# Patient Record
Sex: Male | Born: 1990 | Marital: Single | Smoking: Never smoker
Health system: Southern US, Community
[De-identification: ages and names within clinical notes are randomized; demographics above are authoritative.]

## PROBLEM LIST (undated history)

## (undated) DIAGNOSIS — E611 Iron deficiency: Secondary | ICD-10-CM

## (undated) DIAGNOSIS — I10 Essential (primary) hypertension: Secondary | ICD-10-CM

## (undated) DIAGNOSIS — T7840XA Allergy, unspecified, initial encounter: Secondary | ICD-10-CM

## (undated) DIAGNOSIS — R000 Tachycardia, unspecified: Secondary | ICD-10-CM

## (undated) DIAGNOSIS — R110 Nausea: Secondary | ICD-10-CM

## (undated) DIAGNOSIS — G901 Familial dysautonomia [Riley-Day]: Secondary | ICD-10-CM

## (undated) DIAGNOSIS — E559 Vitamin D deficiency, unspecified: Secondary | ICD-10-CM

## (undated) DIAGNOSIS — Z789 Other specified health status: Secondary | ICD-10-CM

## (undated) DIAGNOSIS — Z8639 Personal history of other endocrine, nutritional and metabolic disease: Secondary | ICD-10-CM

## (undated) DIAGNOSIS — K529 Noninfective gastroenteritis and colitis, unspecified: Secondary | ICD-10-CM

## (undated) DIAGNOSIS — Z903 Acquired absence of stomach [part of]: Secondary | ICD-10-CM

## (undated) DIAGNOSIS — E119 Type 2 diabetes mellitus without complications: Secondary | ICD-10-CM

## (undated) DIAGNOSIS — R42 Dizziness and giddiness: Secondary | ICD-10-CM

## (undated) DIAGNOSIS — R0602 Shortness of breath: Secondary | ICD-10-CM

## (undated) HISTORY — DX: Allergy, unspecified, initial encounter: T78.40XA

## (undated) HISTORY — DX: Noninfective gastroenteritis and colitis, unspecified: K52.9

## (undated) HISTORY — DX: Shortness of breath: R06.02

## (undated) HISTORY — DX: Essential (primary) hypertension: I10

## (undated) HISTORY — DX: Tachycardia, unspecified: R00.0

## (undated) HISTORY — DX: Personal history of other endocrine, nutritional and metabolic disease: Z86.39

## (undated) HISTORY — DX: Vitamin D deficiency, unspecified: E55.9

## (undated) HISTORY — DX: Acquired absence of stomach (part of): Z90.3

## (undated) HISTORY — DX: Familial dysautonomia (riley-day): G90.1

## (undated) HISTORY — PX: LAPAROSCOPIC GASTRIC SLEEVE RESECTION: SHX5895

## (undated) HISTORY — DX: Dizziness and giddiness: R42

## (undated) HISTORY — DX: Nausea: R11.0

## (undated) HISTORY — DX: Iron deficiency: E61.1

## (undated) HISTORY — DX: Other specified health status: Z78.9

## (undated) HISTORY — PX: OTHER SURGICAL HISTORY: SHX169

---
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
## 2008-10-25 ENCOUNTER — Emergency Department (HOSPITAL_COMMUNITY): Admission: EM | Admit: 2008-10-25 | Discharge: 2008-10-25 | Payer: Self-pay | Admitting: Emergency Medicine

## 2010-01-05 ENCOUNTER — Emergency Department (HOSPITAL_COMMUNITY): Admission: EM | Admit: 2010-01-05 | Discharge: 2010-01-05 | Payer: Self-pay | Admitting: Family Medicine

## 2011-08-23 ENCOUNTER — Emergency Department (HOSPITAL_COMMUNITY)
Admission: EM | Admit: 2011-08-23 | Discharge: 2011-08-23 | Disposition: A | Discharge status: Home / Self Care | Payer: Self-pay | Attending: Emergency Medicine | Admitting: Emergency Medicine

## 2011-08-23 ENCOUNTER — Emergency Department (HOSPITAL_COMMUNITY): Payer: Self-pay

## 2011-08-23 DIAGNOSIS — R112 Nausea with vomiting, unspecified: Secondary | ICD-10-CM | POA: Insufficient documentation

## 2011-08-23 DIAGNOSIS — R10811 Right upper quadrant abdominal tenderness: Secondary | ICD-10-CM | POA: Insufficient documentation

## 2011-08-23 DIAGNOSIS — R1013 Epigastric pain: Secondary | ICD-10-CM | POA: Insufficient documentation

## 2011-08-23 LAB — COMPREHENSIVE METABOLIC PANEL
Albumin: 4.7 g/dL (ref 3.5–5.2)
Alkaline Phosphatase: 65 U/L (ref 39–117)
BUN: 11 mg/dL (ref 6–23)
CO2: 26 mEq/L (ref 19–32)
Chloride: 101 mEq/L (ref 96–112)
Creatinine, Ser: 0.79 mg/dL (ref 0.50–1.10)
GFR calc Af Amer: >60 mL/min (ref >60)
GFR calc non Af Amer: >60 mL/min (ref >60)
Glucose, Bld: 97 mg/dL (ref 70–99)
Potassium: 3.9 mEq/L (ref 3.5–5.1)
Total Bilirubin: 0.2 mg/dL — ABNORMAL LOW (ref 0.3–1.2)

## 2011-08-23 LAB — CBC
Hemoglobin: 13.6 g/dL (ref 12.0–15.0)
MCH: 27.3 pg (ref 26.0–34.0)
MCHC: 33.9 g/dL (ref 30.0–36.0)
Platelets: 304 10*3/uL (ref 150–400)
RBC: 4.98 MIL/uL (ref 3.87–5.11)

## 2011-08-23 LAB — URINALYSIS, ROUTINE W REFLEX MICROSCOPIC
Bilirubin Urine: NEGATIVE
Hgb urine dipstick: NEGATIVE
Nitrite: NEGATIVE
Specific Gravity, Urine: 1.028 (ref 1.005–1.030)
pH: 6.5 (ref 5.0–8.0)

## 2011-08-23 LAB — DIFFERENTIAL
Eosinophils Absolute: 0.1 10*3/uL (ref 0.0–0.7)
Lymphocytes Relative: 18 % (ref 12–46)
Lymphs Abs: 1.4 10*3/uL (ref 0.7–4.0)
Monocytes Relative: 5 % (ref 3–12)
Neutro Abs: 6.1 10*3/uL (ref 1.7–7.7)
Neutrophils Relative %: 76 % (ref 43–77)

## 2011-08-23 LAB — PREGNANCY, URINE: Preg Test, Ur: NEGATIVE

## 2011-09-29 LAB — POCT I-STAT, CHEM 8
BUN: 10
Calcium, Ion: 1.16
Chloride: 104
Creatinine, Ser: 0.9
Glucose, Bld: 78
HCT: 39
Hemoglobin: 13.3
Potassium: 4
Sodium: 137
TCO2: 24

## 2011-09-29 LAB — URINALYSIS, ROUTINE W REFLEX MICROSCOPIC
Bilirubin Urine: NEGATIVE
Glucose, UA: NEGATIVE
Ketones, ur: NEGATIVE
Protein, ur: NEGATIVE
pH: 7

## 2014-06-16 ENCOUNTER — Emergency Department (HOSPITAL_COMMUNITY)
Admission: EM | Admit: 2014-06-16 | Discharge: 2014-06-16 | Disposition: A | Discharge status: Home / Self Care | Payer: BC Managed Care – PPO | Attending: Emergency Medicine | Admitting: Emergency Medicine

## 2014-06-16 ENCOUNTER — Encounter (HOSPITAL_COMMUNITY): Payer: Self-pay | Admitting: Emergency Medicine

## 2014-06-16 DIAGNOSIS — R1013 Epigastric pain: Secondary | ICD-10-CM | POA: Insufficient documentation

## 2014-06-16 DIAGNOSIS — R1010 Upper abdominal pain, unspecified: Secondary | ICD-10-CM

## 2014-06-16 DIAGNOSIS — E119 Type 2 diabetes mellitus without complications: Secondary | ICD-10-CM | POA: Insufficient documentation

## 2014-06-16 HISTORY — DX: Type 2 diabetes mellitus without complications: E11.9

## 2014-06-16 LAB — COMPREHENSIVE METABOLIC PANEL
ALT: 28 U/L (ref 0–35)
AST: 35 U/L (ref 0–37)
Albumin: 4 g/dL (ref 3.5–5.2)
Alkaline Phosphatase: 53 U/L (ref 39–117)
BUN: 8 mg/dL (ref 6–23)
CALCIUM: 9.4 mg/dL (ref 8.4–10.5)
CO2: 25 meq/L (ref 19–32)
CREATININE: 0.91 mg/dL (ref 0.50–1.10)
Chloride: 101 mEq/L (ref 96–112)
GFR calc non Af Amer: 88 mL/min — ABNORMAL LOW (ref >90)
GLUCOSE: 105 mg/dL — AB (ref 70–99)
Potassium: 4.4 mEq/L (ref 3.7–5.3)
Sodium: 136 mEq/L — ABNORMAL LOW (ref 137–147)
TOTAL PROTEIN: 8.1 g/dL (ref 6.0–8.3)
Total Bilirubin: 0.2 mg/dL — ABNORMAL LOW (ref 0.3–1.2)

## 2014-06-16 LAB — CBC
HCT: 43.9 % (ref 36.0–46.0)
HEMOGLOBIN: 14.6 g/dL (ref 12.0–15.0)
MCH: 25.5 pg — AB (ref 26.0–34.0)
MCHC: 33.3 g/dL (ref 30.0–36.0)
MCV: 76.7 fL — ABNORMAL LOW (ref 78.0–100.0)
Platelets: 325 10*3/uL (ref 150–400)
RBC: 5.72 MIL/uL — ABNORMAL HIGH (ref 3.87–5.11)
RDW: 15.9 % — ABNORMAL HIGH (ref 11.5–15.5)
WBC: 6.6 10*3/uL (ref 4.0–10.5)

## 2014-06-16 LAB — LIPASE, BLOOD: LIPASE: 24 U/L (ref 11–59)

## 2014-06-16 MED ORDER — FAMOTIDINE 20 MG PO TABS
20.0000 mg | ORAL_TABLET | Freq: Once | ORAL | Status: AC
  Administered 2014-06-16: 20 mg via ORAL
  Filled 2014-06-16: qty 1

## 2014-06-16 MED ORDER — HYDROCODONE-ACETAMINOPHEN 5-325 MG PO TABS
1.0000 | ORAL_TABLET | Freq: Once | ORAL | Status: AC
  Administered 2014-06-16: 1 via ORAL
  Filled 2014-06-16: qty 1

## 2014-06-16 NOTE — ED Notes (Addendum)
Pt arrived to the ED with a complaint of abdominal pain. Pt has experienced pain for about 3 hours.  Pts' pain is located mid abdomen on the right side.  Pt states that it hurts when pushed upon.  Pt state initially the pain caused her to double over.  Pt is in a sexual transition and is on testosterone.

## 2014-06-16 NOTE — Discharge Instructions (Signed)
You may try pepcid, and/or maalox as need for symptom relief. Follow up with primary care doctor this Monday if symptoms fail to improve/resolve. Return to ER right away if worse, worsening or severe pain, persistent vomiting, fevers, other concern.   You were given pain medication in the ER - no driving for the next 4 hours.     Abdominal Pain Many things can cause abdominal pain. Usually, abdominal pain is not caused by a disease and will improve without treatment. It can often be observed and treated at home. Your health care provider will do a physical exam and possibly order blood tests and X-rays to help determine the seriousness of your pain. However, in many cases, more time must pass before a clear cause of the pain can be found. Before that point, your health care provider may not know if you need more testing or further treatment. HOME CARE INSTRUCTIONS  Monitor your abdominal pain for any changes. The following actions may help to alleviate any discomfort you are experiencing:  Only take over-the-counter or prescription medicines as directed by your health care provider.  Do not take laxatives unless directed to do so by your health care provider.  Try a clear liquid diet (broth, tea, or water) as directed by your health care provider. Slowly move to a bland diet as tolerated. SEEK MEDICAL CARE IF:  You have unexplained abdominal pain.  You have abdominal pain associated with nausea or diarrhea.  You have pain when you urinate or have a bowel movement.  You experience abdominal pain that wakes you in the night.  You have abdominal pain that is worsened or improved by eating food.  You have abdominal pain that is worsened with eating fatty foods.  You have a fever. SEEK IMMEDIATE MEDICAL CARE IF:   Your pain does not go away within 2 hours.  You keep throwing up (vomiting).  Your pain is felt only in portions of the abdomen, such as the right side or the left lower  portion of the abdomen.  You pass bloody or black tarry stools. MAKE SURE YOU:  Understand these instructions.   Will watch your condition.   Will get help right away if you are not doing well or get worse.  Document Released: 09/23/2005 Document Revised: 12/19/2013 Document Reviewed: 08/23/2013 [facility] Patient Information 2015 [employer] [place]. This information is not intended to replace advice given to you by your health care provider. Make sure you discuss any questions you have with your health care provider.

## 2014-06-16 NOTE — ED Provider Notes (Signed)
CSN: [id number]     Arrival date & time 06/16/14  2001 History   First MD Initiated Contact with Patient 06/16/14 2021     Chief Complaint  Patient presents with  . Abdominal Pain     (Consider location/radiation/quality/duration/timing/severity/associated sxs/prior Treatment) Patient is a 23 y.o. male presenting with abdominal pain. The history is provided by the patient.  Abdominal Pain Associated symptoms: no chest pain, no chills, no constipation, no cough, no diarrhea, no dysuria, no fever, no hematuria, no shortness of breath, no sore throat, no vaginal bleeding, no vaginal discharge and no vomiting   pt c/o epigastric/upper abd pain. Constant, dull, moderate, non radiating. Pt states was at work, at rest, had stretched arms up above head prior to onset symptoms. No nv. Having normal bms, no diarrhea or constipation. No prior abd surgery. No hx pud, pancreatitis, or gallstones. No back or flank pain. No gu c/o. No mid to lower abd pain. No chest pain or pleuritic pain. No sob, cough or uri c/o. No fever or chills. Normal appetite.      Past Medical History  Diagnosis Date  . Diabetes mellitus without complication    History reviewed. No pertinent past surgical history. History reviewed. No pertinent family history. History  Substance Use Topics  . Smoking status: Never Smoker   . Smokeless tobacco: Not on file  . Alcohol Use: No   OB History   Grav Para Term Preterm Abortions TAB SAB Ect Mult Living                 Review of Systems  Constitutional: Negative for fever and chills.  HENT: Negative for sore throat.   Eyes: Negative for redness.  Respiratory: Negative for cough and shortness of breath.   Cardiovascular: Negative for chest pain.  Gastrointestinal: Positive for abdominal pain. Negative for vomiting, diarrhea, constipation and abdominal distention.  Genitourinary: Negative for dysuria, hematuria, flank pain, vaginal bleeding and vaginal discharge.   Musculoskeletal: Negative for back pain and neck pain.  Skin: Negative for rash.  Neurological: Negative for headaches.  Hematological: Does not bruise/bleed easily.  Psychiatric/Behavioral: Negative for confusion.      Allergies  Review of patient's allergies indicates not on file.  Home Medications   Prior to Admission medications   Not on File   BP 148/54  Pulse 94  Temp(Src) 99 F (37.2 C) (Oral)  Resp 18  SpO2 99%  LMP 12/16/2013 Physical Exam  Nursing note and vitals reviewed. Constitutional: She appears well-developed and well-nourished. No distress.  HENT:  Mouth/Throat: Oropharynx is clear and moist.  Eyes: Conjunctivae are normal. No scleral icterus.  Neck: Neck supple. No tracheal deviation present.  Cardiovascular: Normal rate, regular rhythm, normal heart sounds and intact distal pulses.   Pulmonary/Chest: Effort normal and breath sounds normal. No respiratory distress.  Abdominal: Soft. Normal appearance and bowel sounds are normal. She exhibits no distension and no mass. There is no rebound and no guarding.  Obese. No hernia. Mild epigastric/upper abd tenderness.   Genitourinary:  No cva tenderness  Musculoskeletal: She exhibits no edema.  Neurological: She is alert.  Skin: Skin is warm and dry. No rash noted. She is not diaphoretic.  Psychiatric: She has a normal mood and affect.    ED Course  Procedures (including critical care time) Labs Review   Results for orders placed during the hospital encounter of 06/16/14  CBC      Result Value Ref Range   WBC 6.6  4.0 -  10.5 K/uL   RBC 5.72 (*) 3.87 - 5.11 MIL/uL   Hemoglobin 14.6  12.0 - 15.0 g/dL   HCT 95.243.9  84.136.0 - 32.446.0 %   MCV 76.7 (*) 78.0 - 100.0 fL   MCH 25.5 (*) 26.0 - 34.0 pg   MCHC 33.3  30.0 - 36.0 g/dL   RDW 40.115.9 (*) 02.711.5 - 25.315.5 %   Platelets 325  150 - 400 K/uL  COMPREHENSIVE METABOLIC PANEL      Result Value Ref Range   Sodium 136 (*) 137 - 147 mEq/L   Potassium 4.4  3.7 - 5.3  mEq/L   Chloride 101  96 - 112 mEq/L   CO2 25  19 - 32 mEq/L   Glucose, Bld 105 (*) 70 - 99 mg/dL   BUN 8  6 - 23 mg/dL   Creatinine, Ser 6.640.91  0.50 - 1.10 mg/dL   Calcium 9.4  8.4 - 40.310.5 mg/dL   Total Protein 8.1  6.0 - 8.3 g/dL   Albumin 4.0  3.5 - 5.2 g/dL   AST 35  0 - 37 U/L   ALT 28  0 - 35 U/L   Alkaline Phosphatase 53  39 - 117 U/L   Total Bilirubin 0.2 (*) 0.3 - 1.2 mg/dL   GFR calc non Af Amer 88 (*) >90 mL/min   GFR calc Af Amer >90  >90 mL/min  LIPASE, BLOOD      Result Value Ref Range   Lipase 24  11 - 59 U/L     MDM  Labs. Pepcid, vicodin po (pt has ride, does not have to drive).  Reviewed nursing notes and prior charts for additional history.   Recheck abd soft nt.  Pt afeb.   Recheck tolerating po. abd soft nt. Pt appears stable for d/c.     Suzi RootsKevin E Steinl, MD 06/16/14 2228

## 2014-08-25 ENCOUNTER — Encounter: Payer: Self-pay | Admitting: *Deleted

## 2014-08-25 ENCOUNTER — Ambulatory Visit (INDEPENDENT_AMBULATORY_CARE_PROVIDER_SITE_OTHER): Payer: BC Managed Care – PPO | Admitting: Internal Medicine

## 2014-08-25 VITALS — BP 116/72 | HR 85 | Temp 98.3°F | Resp 20 | Ht 63.0 in | Wt 288.8 lb

## 2014-08-25 DIAGNOSIS — Z6841 Body Mass Index (BMI) 40.0 and over, adult: Secondary | ICD-10-CM

## 2014-08-25 DIAGNOSIS — J029 Acute pharyngitis, unspecified: Secondary | ICD-10-CM

## 2014-08-25 LAB — POCT RAPID STREP A (OFFICE): RAPID STREP A SCREEN: NEGATIVE

## 2014-08-25 MED ORDER — AMOXICILLIN 500 MG PO CAPS: 1000.0000 mg | ORAL_CAPSULE | Freq: Two times a day (BID) | ORAL | Status: AC

## 2014-08-25 NOTE — Progress Notes (Signed)
   Subjective:  This chart was scribed for Mike Sia, MD by Carl Best, Medical Scribe. This patient was seen in Room 12 and the patient's care was started at 2:13 PM.   Patient ID: Mike Boyd, male    DOB: 03-26-1991, 23 y.o.   MRN: [id number]  HPI HPI Comments: Mike Boyd is a 23 y.o. male who presents to the [facility] complaining of sore throat for three days.  Progressively worse.  No cough, fever, fatigue, or rhinorrhea.    Past Medical History  Diagnosis Date  . Diabetes mellitus without complication   . Allergy   . Colitis   . Hypertension    History reviewed. No pertinent past surgical history. History reviewed. No pertinent family history. History   Social History  . Marital Status: Single    Spouse Name: N/A    Number of Children: N/A  . Years of Education: N/A   Occupational History  . Not on file.   Social History Main Topics  . Smoking status: Never Smoker   . Smokeless tobacco: Never Used  . Alcohol Use: No  . Drug Use: No  . Sexual Activity: Yes   Other Topics Concern  . Not on file   Social History Narrative  . No narrative on file   No Known Allergies   Review of Systems  Constitutional: Negative for fever and fatigue.  HENT: Negative for rhinorrhea.   Respiratory: Negative for cough.      Objective:  Physical Exam  Nursing note and vitals reviewed. Constitutional: She is oriented to person, place, and time. She appears well-developed and well-nourished. No distress.  HENT:  Head: Normocephalic and atraumatic.  Nose: Nose normal.  Throat with enlarged tonsils with exudate.  Eyes: Conjunctivae and EOM are normal. Pupils are equal, round, and reactive to light.  Neck: Neck supple.  Cardiovascular: Normal rate.   Pulmonary/Chest: Effort normal.  Lymphadenopathy:    She has cervical adenopathy.  Tender anterior cervical nodes.  Neurological: She is alert and oriented to person, place, and  time. No cranial nerve deficit.  Psychiatric: She has a normal mood and affect. Her behavior is normal.   Results for orders placed in visit on 08/25/14  POCT RAPID STREP A (OFFICE)      Result Value Ref Range   Rapid Strep A Screen Negative  Negative     BP 116/72  Pulse 85  Temp(Src) 98.3 F (36.8 C) (Oral)  Resp 20  Ht  (1.6 m)  Wt 288 lb 12 oz (130.976 kg)  BMI 51.16 kg/m2 Assessment & Plan:    I personally performed the services described in this documentation, which was scribed in my presence. The recorded information has been reviewed and is accurate. Acute pharyngitis, unspecified pharyngitis type - Plan: POCT rapid strep A, Throat culture Loney Loh)  Meds ordered this encounter  Medications  . amoxicillin (AMOXIL) 500 MG capsule    Sig: Take 2 capsules (1,000 mg total) by mouth 2 (two) times daily.    Dispense:  40 capsule    Refill:  0   TC sent/call

## 2014-08-27 LAB — CULTURE, GROUP A STREP

## 2014-09-12 ENCOUNTER — Ambulatory Visit (INDEPENDENT_AMBULATORY_CARE_PROVIDER_SITE_OTHER): Payer: BC Managed Care – PPO | Admitting: Family Medicine

## 2014-09-12 VITALS — BP 110/80 | HR 81 | Temp 97.9°F | Resp 16 | Ht 64.0 in | Wt 285.0 lb

## 2014-09-12 DIAGNOSIS — H109 Unspecified conjunctivitis: Secondary | ICD-10-CM

## 2014-09-12 MED ORDER — TOBRAMYCIN 0.3 % OP SOLN: 1.0000 [drp] | Freq: Four times a day (QID) | OPHTHALMIC | Status: DC

## 2014-09-12 NOTE — Progress Notes (Signed)
Chief Complaint:  Chief Complaint  Patient presents with  . Eye Problem    x3 days; pt has been having pink eye sxs and has not been using any OTC eye meds   . Eye Drainage    x3 days; pt states eye drains more during the day  . Itchy Eye    x3 days; pt states eye is very itchy and it burns    HPI: 23 y.o. year old male presents with a 3 day history of eye redness involving left eye.  no contact use  No sick contacts, recent antibiotics, or recent travels.   No leg trauma, sedentary periods, h/o cancer, or tobacco use.  Past Medical History  Diagnosis Date  . Diabetes mellitus without complication   . Allergy   . Colitis   . Hypertension      Home Meds: Prior to Admission medications   Medication Sig Start Date End Date Taking? Authorizing Provider  beclomethasone (QVAR) 40 MCG/ACT inhaler Inhale 2 puffs into the lungs 2 (two) times daily.   Yes Historical Provider, MD  hydrochlorothiazide (HYDRODIURIL) 25 MG tablet Take 25 mg by mouth daily as needed (fluid).   Yes Historical Provider, MD  testosterone cypionate (DEPOTESTOTERONE CYPIONATE) 100 MG/ML injection Inject 150 mg into the muscle every 14 (fourteen) days. For IM use only. Inject on Saturdays.   Yes Historical Provider, MD    Allergies: No Known Allergies  History   Social History  . Marital Status: Single    Spouse Name: N/A    Number of Children: N/A  . Years of Education: N/A   Occupational History  . Not on file.   Social History Main Topics  . Smoking status: Never Smoker   . Smokeless tobacco: Never Used  . Alcohol Use: No  . Drug Use: No  . Sexual Activity: Yes   Other Topics Concern  . Not on file   Social History Narrative  . No narrative on file     Review of Systems: Constitutional: negative for chills, fever, night sweats or weight changes Cardiovascular: negative for chest pain or palpitations Respiratory: negative for hemoptysis, wheezing, or shortness of breath Abdominal:  negative for abdominal pain, nausea, vomiting or diarrhea Dermatological: negative for rash Neurologic: negative for headache   Physical Exam: Blood pressure 110/80, pulse 81, temperature 97.9 F (36.6 C), temperature source Oral, resp. rate 16, height  (1.626 m), weight 285 lb (129.275 kg), SpO2 96.00%., Body mass index is 48.9 kg/(m^2). General: Well developed, well nourished, in no acute distress. Head: Normocephalic, atraumatic, nares are congested. Bilateral auditory canals clear, TM's are without perforation, pearly grey with reflective cone of light bilaterally. No sinus TTP. Oral cavity moist, dentition normal. Posterior pharynx with post nasal drip and mild erythema. No peritonsillar abscess or tonsillar exudate. Eyes:  Injection left eye, mild discharge at lid margins Neck: Supple. No thyromegaly. Full ROM. No lymphadenopathy. Lungs: Coarse breath sounds bilaterally without wheezes, rales, or rhonchi. Breathing is unlabored.  Heart: RRR with S1 S2. No murmurs, rubs, or gallops appreciated. Msk:  Strength and tone normal for age. Extremities: No clubbing or cyanosis. No edema. Neuro: Alert and oriented X 3. Moves all extremities spontaneously. CNII-XII grossly in tact. Psych:  Responds to questions appropriately with a normal affect.    ASSESSMENT AND PLAN:  23 y.o. year old male with conjunctivitis involving left eye. Conjunctivitis of left eye - Plan: tobramycin (TOBREX) 0.3 % ophthalmic solution  Pt ed given including hygiene measures and  avoiding use of contact lenses -RTC precautions -RTC 3-5 days if no improvement  Signed, Elvina Sidle, MD 09/12/2014 1:35 PM

## 2014-09-12 NOTE — Patient Instructions (Signed)

## 2014-10-10 ENCOUNTER — Ambulatory Visit (INDEPENDENT_AMBULATORY_CARE_PROVIDER_SITE_OTHER): Payer: BC Managed Care – PPO

## 2014-10-10 ENCOUNTER — Ambulatory Visit (INDEPENDENT_AMBULATORY_CARE_PROVIDER_SITE_OTHER): Payer: BC Managed Care – PPO | Admitting: Family Medicine

## 2014-10-10 VITALS — BP 116/84 | HR 88 | Temp 98.4°F | Resp 20 | Ht 65.0 in | Wt 284.6 lb

## 2014-10-10 DIAGNOSIS — G8911 Acute pain due to trauma: Secondary | ICD-10-CM

## 2014-10-10 DIAGNOSIS — M25512 Pain in left shoulder: Secondary | ICD-10-CM

## 2014-10-10 DIAGNOSIS — S83512A Sprain of anterior cruciate ligament of left knee, initial encounter: Secondary | ICD-10-CM

## 2014-10-10 NOTE — Progress Notes (Signed)
  Mike SchlichterKamisha Dansby - 23 y.o. male MRN [id number]  Date of birth: 11/25/1991  SUBJECTIVE:  Including CC & ROS.  patient C/O: left shoulder pain  Onset of symptoms: 2 days ago Symptoms: Patient reports that 2 nights ago she was having dinner with a friend when her  friend starting having a seizure and was about to fall to the ground when the patient caught her in her arms. Her friend impacted into her left shoulder while falling. Ever since this event she's had some shoulder pain with range of motion but not at rest. Describes pain as a dull ache. She denies any previous injury to the shoulder. Pain is worse with rotational activities such as putting on her bra. No numbness or tingling. No neck pain  Relieving factors: no ice or NSAIDS taken Worsened by: overhead movement   ROS:  Constitutional:  No fever, chills, or fatigue.  Respiratory:  No shortness of breath, cough, or wheezing Cardiovascular:  No palpitations, chest pain or syncope Gastrointestinal:  No nausea, no abdominal pain Neuro: no numbness or tingling no neck pain Review of systems otherwise negative except for what is stated in HPI  HISTORY: Past Medical, Surgical, Social, and Family History Reviewed & Updated per EMR. Pertinent Historical Findings include: Asthma with no current breathing issues  PHYSICAL EXAM:  VS: BP:116/84 mmHg  HR:88bpm  TEMP:98.4 F (36.9 C)(Oral)  RESP:98 %  HT:5\' 5"  (165.1 cm)   WT:284 lb 9.6 oz (129.094 kg)  BMI:47.5 PHYSICAL EXAM: Respiratory:  Lungs are clear to auscultation, Respirations are non-labored, Symmetrical chest wall expansion.   SHOULDER EXAM:  General: well nourished Skin of UE: warm; dry, no rashes, lesions, ecchymosis or erythema. Vascular: radial pulses 2+ bilaterally Neurologically: Normal sensation with no sensory or motor defects in C4-C8, bilateral Palpation: moderate tenderness over the A Rosie PlaceC joint and acromion, no clavicle tenderness at mid or proximal shaft, mild bicipital  grove tenderness, mildsupraspinatus tenderness ROM active/passive: symmetric full 180 degree of abduction and forward flexion pain at end range of motion, symmetric internal (80-90) and external rotation (90) with shoulder at 90 abduction. Appley's scratch test equal bilaterally Strength testing: 5/5 symmetric strength in internal and external rotation, forward flexion, adduction and abduction     Special Test: Negative Neer's, neg Hawkins, negative empty can, neg O'Brien, neg     speeds, neg apprehension  4 view x-rays of the left shoulder and humerus reveal no signs of fracture, no dislocation, no evidence of a.c. joint separation  ASSESSMENT & PLAN:  AC joint sprain with no separation   Recommendations: Recommend conservative management with sling as needed for comfort. Advised patient not to become dependent on using the sling as this can increase stiffness in the shoulder and elbow Recommend anti-inflammatories patient was offered a prescription for Mobic to prefer to take over-the-counter Aleve. Recommended taking 2 Aleve in the morning with breakfast and 2 Aleve at dinner time. Advised patient to work on range of motion and stretching Advised patient that I suspect her course recovery will improve over the next 7-10 days. If symptoms are persisting at 3 weeks to a month she should follow up in office Patient provided with a work note for today. No other work restriction were requested by the patient.

## 2014-11-14 ENCOUNTER — Ambulatory Visit (INDEPENDENT_AMBULATORY_CARE_PROVIDER_SITE_OTHER): Payer: BC Managed Care – PPO | Admitting: Emergency Medicine

## 2014-11-14 VITALS — BP 122/84 | HR 87 | Temp 98.0°F | Resp 16 | Ht 64.0 in | Wt 295.6 lb

## 2014-11-14 DIAGNOSIS — R5383 Other fatigue: Secondary | ICD-10-CM

## 2014-11-14 DIAGNOSIS — R55 Syncope and collapse: Secondary | ICD-10-CM

## 2014-11-14 LAB — POCT UA - MICROSCOPIC ONLY
BACTERIA, U MICROSCOPIC: NEGATIVE
Casts, Ur, LPF, POC: NEGATIVE
Crystals, Ur, HPF, POC: NEGATIVE
Epithelial cells, urine per micros: NEGATIVE
Mucus, UA: NEGATIVE
RBC, urine, microscopic: NEGATIVE
WBC, UR, HPF, POC: NEGATIVE
YEAST UA: NEGATIVE

## 2014-11-14 LAB — CBC WITH DIFFERENTIAL/PLATELET
Basophils Absolute: 0 10*3/uL (ref 0.0–0.1)
Basophils Relative: 0 % (ref 0–1)
EOS PCT: 3 % (ref 0–5)
Eosinophils Absolute: 0.2 10*3/uL (ref 0.0–0.7)
HCT: 41.2 % (ref 36.0–46.0)
HEMOGLOBIN: 13.6 g/dL (ref 12.0–15.0)
LYMPHS ABS: 2.4 10*3/uL (ref 0.7–4.0)
LYMPHS PCT: 31 % (ref 12–46)
MCH: 26.8 pg (ref 26.0–34.0)
MCHC: 33 g/dL (ref 30.0–36.0)
MCV: 81.1 fL (ref 78.0–100.0)
MPV: 9.3 fL — AB (ref 9.4–12.4)
Monocytes Absolute: 0.6 10*3/uL (ref 0.1–1.0)
Monocytes Relative: 8 % (ref 3–12)
NEUTROS ABS: 4.6 10*3/uL (ref 1.7–7.7)
NEUTROS PCT: 58 % (ref 43–77)
Platelets: 339 10*3/uL (ref 150–400)
RBC: 5.08 MIL/uL (ref 3.87–5.11)
RDW: 15.1 % (ref 11.5–15.5)
WBC: 7.9 10*3/uL (ref 4.0–10.5)

## 2014-11-14 LAB — GLUCOSE, POCT (MANUAL RESULT ENTRY): POC Glucose: 85 mg/dl (ref 70–99)

## 2014-11-14 LAB — POCT GLYCOSYLATED HEMOGLOBIN (HGB A1C): Hemoglobin A1C: 5.9

## 2014-11-14 LAB — POCT URINALYSIS DIPSTICK
BILIRUBIN UA: NEGATIVE
GLUCOSE UA: NEGATIVE
NITRITE UA: NEGATIVE
Protein, UA: NEGATIVE
RBC UA: NEGATIVE
Spec Grav, UA: 1.02
Urobilinogen, UA: 0.2
pH, UA: 7

## 2014-11-14 NOTE — Progress Notes (Signed)
[facility] [address], [place] [id number] [phone]  Date:  11/14/2014   Name:  Mike Boyd   DOB:  1991-01-17   MRN:  [id number]  PCP:  No PCP Per Patient    Chief Complaint: Dizziness; Fatigue; and Diabetes   History of Present Illness:  Mike Boyd is a 23 y.o. very pleasant male patient who presents with the following:  Patient works in call center.  History of HBP and NIDDM by chart.  Patient says she is a "pre-diabetic". Thirsty, has polyuria.  On Sunday began to slur her words, became hot and dizzy and passed out.  No seizure. No head injury No nausea or vomiting.  Says having diarrhea. No recurrence. No antecedent illness of injury.  No neuro or visual symptoms No improvement with over the counter medications or other home remedies.  Denies other complaint or health concern today.    Patient Active Problem List   Diagnosis Date Noted  . BMI 50.0-59.9, adult 08/25/2014    Past Medical History  Diagnosis Date  . Diabetes mellitus without complication   . Allergy   . Colitis   . Hypertension     No past surgical history on file.  History  Substance Use Topics  . Smoking status: Never Smoker   . Smokeless tobacco: Never Used  . Alcohol Use: No    No family history on file.  No Known Allergies  Medication list has been reviewed and updated.  Current Outpatient Prescriptions on File Prior to Visit  Medication Sig Dispense Refill  . beclomethasone (QVAR) 40 MCG/ACT inhaler Inhale 2 puffs into the lungs 2 (two) times daily.    . hydrochlorothiazide (HYDRODIURIL) 25 MG tablet Take 25 mg by mouth daily as needed (fluid).    Marland Kitchen. testosterone cypionate (DEPOTESTOTERONE CYPIONATE) 100 MG/ML injection Inject 150 mg into the muscle every 14 (fourteen) days. For IM use only. Inject on Saturdays.     No current facility-administered medications on file prior to visit.    Review of Systems:  As per HPI, otherwise negative.     Physical Examination: Filed Vitals:   11/14/14 1226  BP: 122/84  Pulse: 87  Temp: 98 F (36.7 C)  Resp: 16   Filed Vitals:   11/14/14 1226  Height: 5\' 4"  (1.626 m)  Weight: 295 lb 9.6 oz (134.083 kg)   Body mass index is 50.71 kg/(m^2). Ideal Body Weight: Weight in (lb) to have BMI = 25: 145.3  GEN: morbid obesity, NAD, Non-toxic, A & O x 3 HEENT: Atraumatic, Normocephalic. Neck supple. No masses, No LAD. Ears and Nose: No external deformity. CV: RRR, No M/G/R. No JVD. No thrill. No extra heart sounds. PULM: CTA B, no wheezes, crackles, rhonchi. No retractions. No resp. distress. No accessory muscle use. ABD: S, NT, ND, +BS. No rebound. No HSM. EXTR: No c/c/e NEURO Normal gait.  PSYCH: Normally interactive. Conversant. Not depressed or anxious appearing.  Calm demeanor.    Assessment and Plan: Near syncope Await labs   Signed,  Phillips OdorJeffery Porfirio Bollier, MD   Results for orders placed or performed in visit on 11/14/14  POCT glucose (manual entry)  Result Value Ref Range   POC Glucose 85 70 - 99 mg/dl  POCT glycosylated hemoglobin (Hb A1C)  Result Value Ref Range   Hemoglobin A1C 5.9   POCT UA - Microscopic Only  Result Value Ref Range   WBC, Ur, HPF, POC neg    RBC, urine, microscopic neg  Bacteria, U Microscopic neg    Mucus, UA neg    Epithelial cells, urine per micros neg    Crystals, Ur, HPF, POC neg    Casts, Ur, LPF, POC neg    Yeast, UA neg   POCT urinalysis dipstick  Result Value Ref Range   Color, UA yellow    Clarity, UA clear    Glucose, UA neg    Bilirubin, UA neg    Ketones, UA trace    Spec Grav, UA 1.020    Blood, UA neg    pH, UA 7.0    Protein, UA neg    Urobilinogen, UA 0.2    Nitrite, UA neg    Leukocytes, UA small (1+)

## 2014-11-15 ENCOUNTER — Encounter: Payer: Self-pay | Admitting: *Deleted

## 2014-11-15 LAB — COMPREHENSIVE METABOLIC PANEL
ALT: 19 U/L (ref 0–35)
AST: 18 U/L (ref 0–37)
Albumin: 4 g/dL (ref 3.5–5.2)
Alkaline Phosphatase: 58 U/L (ref 39–117)
BUN: 11 mg/dL (ref 6–23)
CALCIUM: 9.2 mg/dL (ref 8.4–10.5)
CHLORIDE: 105 meq/L (ref 96–112)
CO2: 22 meq/L (ref 19–32)
Creat: 1.12 mg/dL — ABNORMAL HIGH (ref 0.50–1.10)
Glucose, Bld: 87 mg/dL (ref 70–99)
Potassium: 4.4 mEq/L (ref 3.5–5.3)
Sodium: 136 mEq/L (ref 135–145)
Total Bilirubin: 0.3 mg/dL (ref 0.2–1.2)
Total Protein: 7.3 g/dL (ref 6.0–8.3)

## 2015-01-09 ENCOUNTER — Encounter (HOSPITAL_COMMUNITY): Payer: Self-pay | Admitting: Emergency Medicine

## 2015-01-09 ENCOUNTER — Emergency Department (HOSPITAL_COMMUNITY)
Admission: EM | Admit: 2015-01-09 | Discharge: 2015-01-10 | Disposition: A | Discharge status: Home / Self Care | Payer: No Typology Code available for payment source | Attending: Emergency Medicine | Admitting: Emergency Medicine

## 2015-01-09 DIAGNOSIS — Z7951 Long term (current) use of inhaled steroids: Secondary | ICD-10-CM | POA: Insufficient documentation

## 2015-01-09 DIAGNOSIS — E119 Type 2 diabetes mellitus without complications: Secondary | ICD-10-CM | POA: Diagnosis not present

## 2015-01-09 DIAGNOSIS — I10 Essential (primary) hypertension: Secondary | ICD-10-CM | POA: Insufficient documentation

## 2015-01-09 DIAGNOSIS — Z8719 Personal history of other diseases of the digestive system: Secondary | ICD-10-CM | POA: Insufficient documentation

## 2015-01-09 DIAGNOSIS — J029 Acute pharyngitis, unspecified: Secondary | ICD-10-CM | POA: Insufficient documentation

## 2015-01-09 NOTE — ED Notes (Signed)
Pt is c/o sore throat  States sxs started 2 days ago

## 2015-01-10 LAB — RAPID STREP SCREEN (MED CTR MEBANE ONLY): STREPTOCOCCUS, GROUP A SCREEN (DIRECT): NEGATIVE

## 2015-01-10 MED ORDER — NAPROXEN 500 MG PO TABS: 500.0000 mg | ORAL_TABLET | Freq: Two times a day (BID) | ORAL | Status: DC

## 2015-01-10 MED ORDER — DEXAMETHASONE SODIUM PHOSPHATE 10 MG/ML IJ SOLN
10.0000 mg | Freq: Once | INTRAMUSCULAR | Status: AC
  Administered 2015-01-10: 10 mg via INTRAMUSCULAR
  Filled 2015-01-10: qty 1

## 2015-01-10 MED ORDER — HYDROCODONE-ACETAMINOPHEN 7.5-325 MG/15ML PO SOLN: 15.0000 mL | ORAL | Status: DC | PRN

## 2015-01-10 NOTE — Discharge Instructions (Signed)
Pharyngitis °Pharyngitis is redness, pain, and swelling (inflammation) of your pharynx.  °CAUSES  °Pharyngitis is usually caused by infection. Most of the time, these infections are from viruses (viral) and are part of a cold. However, sometimes pharyngitis is caused by bacteria (bacterial). Pharyngitis can also be caused by allergies. Viral pharyngitis may be spread from person to person by coughing, sneezing, and personal items or utensils (cups, forks, spoons, toothbrushes). Bacterial pharyngitis may be spread from person to person by more intimate contact, such as kissing.  °SIGNS AND SYMPTOMS  °Symptoms of pharyngitis include:   °· Sore throat.   °· Tiredness (fatigue).   °· Low-grade fever.   °· Headache. °· Joint pain and muscle aches. °· Skin rashes. °· Swollen lymph nodes. °· Plaque-like film on throat or tonsils (often seen with bacterial pharyngitis). °DIAGNOSIS  °Your health care provider will ask you questions about your illness and your symptoms. Your medical history, along with a physical exam, is often all that is needed to diagnose pharyngitis. Sometimes, a rapid strep test is done. Other lab tests may also be done, depending on the suspected cause.  °TREATMENT  °Viral pharyngitis will usually get better in 3-4 days without the use of medicine. Bacterial pharyngitis is treated with medicines that kill germs (antibiotics).  °HOME CARE INSTRUCTIONS  °· Drink enough water and fluids to keep your urine clear or pale yellow.   °· Only take over-the-counter or prescription medicines as directed by your health care provider:   °¨ If you are prescribed antibiotics, make sure you finish them even if you start to feel better.   °¨ Do not take aspirin.   °· Get lots of rest.   °· Gargle with 8 oz of salt water (½ tsp of salt per 1 qt of water) as often as every 1-2 hours to soothe your throat.   °· Throat lozenges (if you are not at risk for choking) or sprays may be used to soothe your throat. °SEEK MEDICAL  CARE IF:  °· You have large, tender lumps in your neck. °· You have a rash. °· You cough up green, yellow-brown, or bloody spit. °SEEK IMMEDIATE MEDICAL CARE IF:  °· Your neck becomes stiff. °· You drool or are unable to swallow liquids. °· You vomit or are unable to keep medicines or liquids down. °· You have severe pain that does not go away with the use of recommended medicines. °· You have trouble breathing (not caused by a stuffy nose). °MAKE SURE YOU:  °· Understand these instructions. °· Will watch your condition. °· Will get help right away if you are not doing well or get worse. °Document Released: 12/14/2005 Document Revised: 10/04/2013 Document Reviewed: 08/21/2013 °ExitCare® Patient Information ©2015 [employer]. This information is not intended to replace advice given to you by your health care provider. Make sure you discuss any questions you have with your health care provider. ° °Salt Water Gargle °This solution will help make your mouth and throat feel better. °HOME CARE INSTRUCTIONS  °· Mix 1 teaspoon of salt in 8 ounces of warm water. °· Gargle with this solution as much or often as you need or as directed. Swish and gargle gently if you have any sores or wounds in your mouth. °· Do not swallow this mixture. °Document Released: 09/17/2004 Document Revised: 03/07/2012 Document Reviewed: 02/08/2009 °ExitCare® Patient Information ©2015 [employer]. This information is not intended to replace advice given to you by your health care provider. Make sure you discuss any questions you have with your health care provider. ° °

## 2015-01-10 NOTE — ED Provider Notes (Signed)
CSN: [id number]     Arrival date & time 01/09/15  2219 History   First MD Initiated Contact with Patient 01/10/15 0011     Chief Complaint  Patient presents with  . Sore Throat    (Consider location/radiation/quality/duration/timing/severity/associated sxs/prior Treatment) Patient is a 24 y.o. male presenting with pharyngitis. The history is provided by the patient. No language interpreter was used.  Sore Throat This is a new problem. The current episode started yesterday. The problem occurs constantly. The problem has been gradually worsening. Associated symptoms include chills and a sore throat. Pertinent negatives include no congestion, coughing, fever, nausea, rash or vomiting. The symptoms are aggravated by swallowing. She has tried nothing for the symptoms. The treatment provided no relief.    Past Medical History  Diagnosis Date  . Diabetes mellitus without complication   . Allergy   . Colitis   . Hypertension    History reviewed. No pertinent past surgical history. Family History  Problem Relation Age of Onset  . Hypertension Other   . Diabetes Other   . Alzheimer's disease Other    History  Substance Use Topics  . Smoking status: Never Smoker   . Smokeless tobacco: Never Used  . Alcohol Use: No   OB History    No data available      Review of Systems  Constitutional: Positive for chills. Negative for fever.  HENT: Positive for sore throat. Negative for congestion, ear pain and rhinorrhea.   Respiratory: Negative for cough.   Gastrointestinal: Negative for nausea and vomiting.  Skin: Negative for rash.  All other systems reviewed and are negative.   Allergies  Review of patient's allergies indicates no known allergies.  Home Medications   Prior to Admission medications   Medication Sig Start Date End Date Taking? Authorizing Provider  beclomethasone (QVAR) 40 MCG/ACT inhaler Inhale 2 puffs into the lungs 2 (two) times daily.    Historical Provider, MD   hydrochlorothiazide (HYDRODIURIL) 25 MG tablet Take 25 mg by mouth daily as needed (fluid).    Historical Provider, MD  HYDROcodone-acetaminophen (HYCET) 7.5-325 mg/15 ml solution Take 15 mLs by mouth every 4 (four) hours as needed for moderate pain or severe pain. 01/10/15   Antony MaduraKelly Alichia Alridge, PA-C  naproxen (NAPROSYN) 500 MG tablet Take 1 tablet (500 mg total) by mouth 2 (two) times daily. 01/10/15   Antony MaduraKelly Dominika Losey, PA-C  testosterone cypionate (DEPOTESTOTERONE CYPIONATE) 100 MG/ML injection Inject 150 mg into the muscle every 14 (fourteen) days. For IM use only. Inject on Saturdays.    Historical Provider, MD   BP 117/78 mmHg  Pulse 96  Temp(Src) 98.1 F (36.7 C) (Oral)  Resp 16  SpO2 98%   Physical Exam  Constitutional: She is oriented to person, place, and time. She appears well-developed and well-nourished. No distress.  Nontoxic/nonseptic appearing  HENT:  Head: Normocephalic and atraumatic.  Mouth/Throat: No oropharyngeal exudate.  Uvula midline. Mildly enlarged tonsils bilaterally. There is mild posterior oropharyngeal erythema without exudates. Patient tolerating secretions without difficulty or drooling. No voice muffling.  Eyes: Conjunctivae and EOM are normal. No scleral icterus.  Neck: Normal range of motion.  No nuchal rigidity or meningismus  Pulmonary/Chest: Effort normal. No respiratory distress.  Respirations even and unlabored  Musculoskeletal: Normal range of motion.  Neurological: She is alert and oriented to person, place, and time. She exhibits normal muscle tone. Coordination normal.  Skin: Skin is warm and dry. No rash noted. She is not diaphoretic. No erythema. No pallor.  Psychiatric:  She has a normal mood and affect. Her behavior is normal.  Nursing note and vitals reviewed.   ED Course  Procedures (including critical care time) Labs Review Labs Reviewed  RAPID STREP SCREEN  CULTURE, GROUP A STREP    Imaging Review No results found.   EKG  Interpretation None      MDM   Final diagnoses:  Viral pharyngitis    Pt afebrile without tonsillar exudate, negative strep. Presents with dysphagia; diagnosis of viral pharyngitis. No abx indicated. DC with symptomatic tx for pain  Pt does not appear dehydrated, but did discuss importance of water rehydration. Presentation non concerning for PTA or infxn spread to soft tissue. No trismus or uvula deviation. Specific return precautions discussed. Pt tolerating secretions in ED without difficulty; intact air way. Recommended PCP follow up. Return precautions discussed and provided. Patient agreeable to plan with no unaddressed concerns.   Filed Vitals:   01/09/15 2224  BP: 117/78  Pulse: 96  Temp: 98.1 F (36.7 C)  TempSrc: Oral  Resp: 16  SpO2: 98%      Antony Madura, PA-C 01/10/15 0110  Arby Barrette, MD 01/11/15 [id number]

## 2015-01-12 LAB — CULTURE, GROUP A STREP

## 2015-02-27 ENCOUNTER — Ambulatory Visit (INDEPENDENT_AMBULATORY_CARE_PROVIDER_SITE_OTHER): Payer: No Typology Code available for payment source | Admitting: Physician Assistant

## 2015-02-27 VITALS — BP 110/70 | HR 97 | Temp 98.1°F | Resp 16 | Ht 64.0 in | Wt 294.0 lb

## 2015-02-27 DIAGNOSIS — Z8709 Personal history of other diseases of the respiratory system: Secondary | ICD-10-CM

## 2015-02-27 DIAGNOSIS — J069 Acute upper respiratory infection, unspecified: Secondary | ICD-10-CM

## 2015-02-27 DIAGNOSIS — B9789 Other viral agents as the cause of diseases classified elsewhere: Principal | ICD-10-CM

## 2015-02-27 MED ORDER — NAPROXEN 500 MG PO TABS: 500.0000 mg | ORAL_TABLET | Freq: Two times a day (BID) | ORAL | Status: DC

## 2015-02-27 MED ORDER — ALBUTEROL SULFATE (2.5 MG/3ML) 0.083% IN NEBU: 2.5000 mg | INHALATION_SOLUTION | Freq: Four times a day (QID) | RESPIRATORY_TRACT | Status: DC | PRN

## 2015-02-27 MED ORDER — CETIRIZINE HCL 10 MG PO TABS: 10.0000 mg | ORAL_TABLET | Freq: Every day | ORAL | Status: DC

## 2015-02-27 MED ORDER — HYDROCODONE-HOMATROPINE 5-1.5 MG/5ML PO SYRP: 5.0000 mL | ORAL_SOLUTION | Freq: Three times a day (TID) | ORAL | Status: DC | PRN

## 2015-02-27 NOTE — Progress Notes (Signed)
02/27/2015 at 1:40 PM  Mike Boyd / DOB: 08/04/1991 / MRN: [id number]  The patient has BMI 50.0-59.9, adult on her problem list.  SUBJECTIVE  Chief compalaint: Cough   History of present illness: Mike Boyd is 24 y.o. well appearing male presenting for viral uri symptoms which she states is moderate. This started 7 days ago and is improving . Associated symptoms include mild cough, sinus congestion, sore throat, HA.  She denies fever, nausea, presyncope, changes in urination and defecation, and leg swelling. She has tried dayquil and aleve with good relief.    She  has a past medical history of Diabetes mellitus without complication; Allergy; Colitis; and Hypertension.    She has a current medication list which includes the following prescription(s): hydrochlorothiazide, naproxen, testosterone cypionate, beclomethasone, and hydrocodone-acetaminophen.  Mike Boyd has No Known Allergies. She  reports that she has never smoked. She has never used smokeless tobacco. She reports that she does not drink alcohol or use illicit drugs. She  reports that she currently engages in sexual activity. The patient  has no past surgical history on file.  Her family history includes Alzheimer's disease in her other; Diabetes in her other; Hypertension in her other.  ROS  Per HPI  OBJECTIVE  Her  height is  (1.626 m) and weight is 294 lb (133.358 kg). Her oral temperature is 98.1 F (36.7 C). Her blood pressure is 110/70 and her pulse is 97. Her respiration is 16 and oxygen saturation is 98%.  The patient's body mass index is 50.44 kg/(m^2).  Physical Exam  Constitutional: She is oriented to person, place, and time. She appears well-developed and well-nourished. No distress.  HENT:  Right Ear: Hearing, tympanic membrane, external ear and ear canal normal.  Left Ear: Hearing, tympanic membrane, external ear and ear canal normal.  Nose: Mucosal edema present. Right sinus exhibits no maxillary  sinus tenderness and no frontal sinus tenderness. Left sinus exhibits no maxillary sinus tenderness and no frontal sinus tenderness.  Mouth/Throat: Uvula is midline, oropharynx is clear and moist and mucous membranes are normal.  Cardiovascular: Normal rate, regular rhythm and normal heart sounds.   Respiratory: Effort normal and breath sounds normal. She has no wheezes. She has no rales.  Neurological: She is alert and oriented to person, place, and time.  Skin: Skin is warm and dry. She is not diaphoretic.  Psychiatric: She has a normal mood and affect.    No results found for this or any previous visit (from the past 24 hour(s)).  ASSESSMENT & PLAN  Mike Boyd was seen today for cough.  Diagnoses and all orders for this visit:  Viral URI with cough Orders: -     albuterol (PROVENTIL) (2.5 MG/3ML) 0.083% nebulizer solution; Take 3 mLs (2.5 mg total) by nebulization every 6 (six) hours as needed for wheezing or shortness of breath. -     naproxen (NAPROSYN) 500 MG tablet; Take 1 tablet (500 mg total) by mouth 2 (two) times daily with a meal. -     cetirizine (ZYRTEC) 10 MG tablet; Take 1 tablet (10 mg total) by mouth daily. -     HYDROcodone-homatropine (HYCODAN) 5-1.5 MG/5ML syrup; Take 5 mLs by mouth every 8 (eight) hours as needed for cough.   The patient was advised to call or come back to clinic if she does not see an improvement in symptoms, or worsens with the above plan.   Deliah Boston, MHS, PA-C [facility] 02/27/2015 1:40 PM

## 2015-05-30 ENCOUNTER — Ambulatory Visit (INDEPENDENT_AMBULATORY_CARE_PROVIDER_SITE_OTHER): Payer: No Typology Code available for payment source | Admitting: Physician Assistant

## 2015-05-30 VITALS — BP 128/88 | HR 98 | Temp 98.9°F | Resp 18 | Ht 63.75 in | Wt 284.0 lb

## 2015-05-30 DIAGNOSIS — R202 Paresthesia of skin: Secondary | ICD-10-CM

## 2015-05-30 DIAGNOSIS — M79643 Pain in unspecified hand: Secondary | ICD-10-CM | POA: Diagnosis not present

## 2015-05-30 LAB — RHEUMATOID FACTOR: Rhuematoid fact SerPl-aCnc: <10 IU/mL (ref <=14)

## 2015-05-30 LAB — COMPREHENSIVE METABOLIC PANEL
ALBUMIN: 4.3 g/dL (ref 3.5–5.2)
ALK PHOS: 56 U/L (ref 39–117)
ALT: 24 U/L (ref 0–35)
AST: 24 U/L (ref 0–37)
BUN: 10 mg/dL (ref 6–23)
CO2: 26 mEq/L (ref 19–32)
CREATININE: 0.93 mg/dL (ref 0.50–1.10)
Calcium: 9.2 mg/dL (ref 8.4–10.5)
Chloride: 102 mEq/L (ref 96–112)
GLUCOSE: 89 mg/dL (ref 70–99)
POTASSIUM: 4.3 meq/L (ref 3.5–5.3)
SODIUM: 135 meq/L (ref 135–145)
TOTAL PROTEIN: 7.4 g/dL (ref 6.0–8.3)
Total Bilirubin: 0.3 mg/dL (ref 0.2–1.2)

## 2015-05-30 LAB — CBC
HEMATOCRIT: 45.8 % (ref 36.0–46.0)
HEMOGLOBIN: 15 g/dL (ref 12.0–15.0)
MCH: 26.5 pg (ref 26.0–34.0)
MCHC: 32.8 g/dL (ref 30.0–36.0)
MCV: 81.1 fL (ref 78.0–100.0)
MPV: 9.3 fL (ref 8.6–12.4)
Platelets: 335 10*3/uL (ref 150–400)
RBC: 5.65 MIL/uL — AB (ref 3.87–5.11)
RDW: 14.9 % (ref 11.5–15.5)
WBC: 6.4 10*3/uL (ref 4.0–10.5)

## 2015-05-30 LAB — POCT GLYCOSYLATED HEMOGLOBIN (HGB A1C): Hemoglobin A1C: 5.4

## 2015-05-30 NOTE — Progress Notes (Signed)
[facility] [address] [place] [address] [phone]  Date:  05/30/2015   Name:  Mike Boyd   DOB:  1991/09/01   MRN:  [id number]  PCP:  No PCP Per Patient    Chief Complaint: Hand Pain; Numbness; and Edema   History of Present Illness:  This is a 24 y.o. male with PMH morbid obesity who is presenting with bilateral hand pain x greater than 1 year. Pain is worse in the left hand. Pain is worse in the mornings. She will get swelling in joints but not swollen now.  Patient is also having numbness that has been going on for one year as well in her bilateral hands. Numbness is worse at night. In her left hand the numbness is located to the palm and digits 1 through 4. In the right hand the numbness is located digits 3 through 5 and part of her forearm. 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 She denies joint pain elsewhere or numbness elsewhere. She denies rashes. She does not have a personal history of autoimmune disease. MGM had RA. Cousins with sickle cell and MS. She denies neck or back pain or weakness. She does not have a history of DM or hypothyroidism.  Pt is transitioning from male to male. She has been on testosterone injections x 16 months.  She states her paresthesias started before starting the testosterone injections.  Review of Systems:  Review of Systems  Constitutional: Negative for fever and chills.  Endocrine: Negative for cold intolerance, heat intolerance, polydipsia, polyphagia and polyuria.  Musculoskeletal: Positive for joint swelling and arthralgias. Negative for neck pain.  Skin: Negative for color change and rash.  Neurological: Positive for numbness. Negative for weakness.  Hematological: Negative for adenopathy.    Patient Active Problem List   Diagnosis Date Noted  . BMI 50.0-59.9, adult 08/25/2014    Prior to Admission medications   Medication Sig Start Date End Date Taking? Authorizing Provider  albuterol (PROVENTIL)  (2.5 MG/3ML) 0.083% nebulizer solution Take 3 mLs (2.5 mg total) by nebulization every 6 (six) hours as needed for wheezing or shortness of breath. 02/27/15  Yes Ofilia Neas, PA-C  beclomethasone (QVAR) 40 MCG/ACT inhaler Inhale 2 puffs into the lungs 2 (two) times daily.   Yes Historical Provider, MD  cetirizine (ZYRTEC) 10 MG tablet Take 1 tablet (10 mg total) by mouth daily. 02/27/15  Yes Ofilia Neas, PA-C  hydrochlorothiazide (HYDRODIURIL) 25 MG tablet Take 25 mg by mouth daily as needed (fluid).   Yes Historical Provider, MD  testosterone cypionate (DEPOTESTOTERONE CYPIONATE) 100 MG/ML injection Inject 150 mg into the muscle every 14 (fourteen) days. For IM use only. Inject on Saturdays.   Yes Historical Provider, MD    No Known Allergies  History reviewed. No pertinent past surgical history.  History  Substance Use Topics  . Smoking status: Never Smoker   . Smokeless tobacco: Never Used  . Alcohol Use: No    Family History  Problem Relation Age of Onset  . Hypertension Other   . Diabetes Other   . Alzheimer's disease Other     Medication list has been reviewed and updated.  Physical Examination:  Physical Exam  Constitutional: She is oriented to person, place, and time. She appears well-developed and well-nourished. No distress.  HENT:  Head: Normocephalic and atraumatic.  Right Ear: Hearing normal.  Left Ear: Hearing normal.  Nose: Nose normal.  Eyes: Conjunctivae and lids are normal. Right  eye exhibits no discharge. Left eye exhibits no discharge. No scleral icterus.  Cardiovascular: Normal rate, regular rhythm, normal heart sounds and normal pulses.   No murmur heard. Pulmonary/Chest: Effort normal and breath sounds normal. No respiratory distress. She has no wheezes. She has no rhonchi. She has no rales.  Musculoskeletal: Normal range of motion.       Right wrist: Normal.       Left wrist: Normal.       Right hand: She exhibits bony tenderness (PIPs and MCPs).  She exhibits normal range of motion, normal capillary refill, no deformity and no swelling. Decreased sensation (dorsal hand, digits 3-5, and ulnar side of forearm. ) noted. Normal strength noted.       Left hand: She exhibits bony tenderness (tenderness over PIPs and MCPs ). She exhibits normal range of motion, normal capillary refill, no deformity and no swelling. Decreased sensation (digits 1-4 and palm of hand. No decreased sensation of forearm) noted. Normal strength noted.  Tinel's test over volar wrist and phalen test both product increased numbness in bilateral hands.  Tinel's test over cubital tunnel negative.  Neurological: She is alert and oriented to person, place, and time.  Skin: Skin is warm, dry and intact. No lesion and no rash noted.  Scalp on hair diffusely thin  Psychiatric: She has a normal mood and affect. Her speech is normal and behavior is normal. Thought content normal.   BP 128/88 mmHg  Pulse 98  Temp(Src) 98.9 F (37.2 C) (Oral)  Resp 18  Ht 5' 3.75" (1.619 m)  Wt 284 lb (128.822 kg)  BMI 49.15 kg/m2  SpO2 97%  Results for orders placed or performed in visit on 05/30/15  POCT glycosylated hemoglobin (Hb A1C)  Result Value Ref Range   Hemoglobin A1C 5.4     Assessment and Plan:  1. Pain, joint, hand, unspecified laterality 2. Paresthesia of both hands Suspect carpal tunnel of both hands and possible ulnar cubital compression of the right elbow. Will get ANA, sedimentation rate, RF to rule out autoimmune disease. A1c to rule out diabetic neuropathy. CMP to evaluate electrolyte abnormalities. Patient was fit for a wrist splint for her left hand since this is the worst hand. We'll see if this helps she will try just night splinting at this time. Referred to orthopedic surgery. She will likely need a nerve conduction study. - Sedimentation Rate - ANA - Rheumatoid factor - POCT glycosylated hemoglobin (Hb A1C) - CBC - Comprehensive metabolic panel -  Ambulatory referral to Orthopedic Surgery  3. Morbid obesity TSH ordered for morbid obesity and diffuse hair thinning. Hair thinning possible due to testosterone injections. - TSH   Roswell MinersNicole V. Dyke BrackettBush, PA-C, MHS [facility]  06/02/2015

## 2015-05-30 NOTE — Patient Instructions (Signed)
You will get a phone call to make appointment with ortho. They will likely do a nerve conduction study. In the meantime wear splint on left wrist at night. Use ibuprofen/tylenol for pain. I will call you with the results of your lab tests.  Electroneurography Test This test measures the conduction of the nerves throughout the body. It is very similar to the way electricity travels throughout your house. When a stimulus is activated at one site, this test determines how long it takes to travel to another location such as making a muscle contract. It would be similar to turning on a light switch and measuring the time it takes for the light bulb to come on. This test is called electroneurography and studies the nerves. PREPARATION FOR TEST No preparation or fasting is necessary. NORMAL FINDINGS No evidence of peripheral nerve injury or disease (conduction velocity is usually lower in the elderly). Ranges for normal findings may vary among different laboratories and hospitals. You should always check with your doctor after having lab work or other tests done to discuss the meaning of your test results and whether your values are considered within normal limits. MEANING OF TEST  Your caregiver will go over the test results with you and discuss the importance and meaning of your results, as well as treatment options and the need for additional tests if necessary. OBTAINING THE TEST RESULTS It is your responsibility to obtain your test results. Ask the lab or department performing the test when and how you will get your results. Document Released: 04/16/2005 Document Revised: 04/30/2014 Document Reviewed: 11/23/2008 [facility] Patient Information 2015 [place]Care, [place]. This information is not intended to replace advice given to you by your health care provider. Make sure you discuss any questions you have with your health care provider.

## 2015-05-31 LAB — ANA: ANA: NEGATIVE

## 2015-05-31 LAB — SEDIMENTATION RATE: SED RATE: 4 mm/h (ref 0–20)

## 2015-05-31 LAB — TSH: TSH: 2.444 u[IU]/mL (ref 0.350–4.500)

## 2015-06-02 DIAGNOSIS — R202 Paresthesia of skin: Secondary | ICD-10-CM | POA: Insufficient documentation

## 2015-06-02 DIAGNOSIS — M25549 Pain in joints of unspecified hand: Secondary | ICD-10-CM | POA: Insufficient documentation

## 2015-08-08 ENCOUNTER — Other Ambulatory Visit: Payer: Self-pay | Admitting: Obstetrics & Gynecology

## 2015-08-09 LAB — CYTOLOGY - PAP

## 2016-02-09 ENCOUNTER — Emergency Department (HOSPITAL_COMMUNITY)
Admission: EM | Admit: 2016-02-09 | Discharge: 2016-02-09 | Disposition: A | Discharge status: Home / Self Care | Payer: 59 | Attending: Emergency Medicine | Admitting: Emergency Medicine

## 2016-02-09 ENCOUNTER — Encounter (HOSPITAL_COMMUNITY): Payer: Self-pay | Admitting: Emergency Medicine

## 2016-02-09 DIAGNOSIS — E119 Type 2 diabetes mellitus without complications: Secondary | ICD-10-CM | POA: Insufficient documentation

## 2016-02-09 DIAGNOSIS — I10 Essential (primary) hypertension: Secondary | ICD-10-CM | POA: Insufficient documentation

## 2016-02-09 DIAGNOSIS — R1110 Vomiting, unspecified: Secondary | ICD-10-CM | POA: Diagnosis not present

## 2016-02-09 DIAGNOSIS — Z3202 Encounter for pregnancy test, result negative: Secondary | ICD-10-CM | POA: Diagnosis not present

## 2016-02-09 DIAGNOSIS — R1032 Left lower quadrant pain: Secondary | ICD-10-CM | POA: Insufficient documentation

## 2016-02-09 LAB — COMPREHENSIVE METABOLIC PANEL
ALK PHOS: 54 U/L (ref 38–126)
ALT: 17 U/L (ref 14–54)
ANION GAP: 7 (ref 5–15)
AST: 23 U/L (ref 15–41)
Albumin: 4.5 g/dL (ref 3.5–5.0)
BUN: 11 mg/dL (ref 6–20)
CO2: 24 mmol/L (ref 22–32)
CREATININE: 0.84 mg/dL (ref 0.44–1.00)
Calcium: 9.3 mg/dL (ref 8.9–10.3)
Chloride: 105 mmol/L (ref 101–111)
Glucose, Bld: 103 mg/dL — ABNORMAL HIGH (ref 65–99)
Potassium: 3.8 mmol/L (ref 3.5–5.1)
Sodium: 136 mmol/L (ref 135–145)
TOTAL PROTEIN: 8.2 g/dL — AB (ref 6.5–8.1)
Total Bilirubin: 0.4 mg/dL (ref 0.3–1.2)

## 2016-02-09 LAB — URINALYSIS, ROUTINE W REFLEX MICROSCOPIC
Bilirubin Urine: NEGATIVE
GLUCOSE, UA: NEGATIVE mg/dL
Hgb urine dipstick: NEGATIVE
KETONES UR: NEGATIVE mg/dL
LEUKOCYTES UA: NEGATIVE
Nitrite: NEGATIVE
PROTEIN: NEGATIVE mg/dL
Specific Gravity, Urine: 1.022 (ref 1.005–1.030)
pH: 8 (ref 5.0–8.0)

## 2016-02-09 LAB — CBC
HCT: 38.7 % (ref 36.0–46.0)
Hemoglobin: 12.6 g/dL (ref 12.0–15.0)
MCH: 26.8 pg (ref 26.0–34.0)
MCHC: 32.6 g/dL (ref 30.0–36.0)
MCV: 82.3 fL (ref 78.0–100.0)
Platelets: 294 10*3/uL (ref 150–400)
RBC: 4.7 MIL/uL (ref 3.87–5.11)
RDW: 12.8 % (ref 11.5–15.5)
WBC: 7.7 10*3/uL (ref 4.0–10.5)

## 2016-02-09 LAB — I-STAT BETA HCG BLOOD, ED (MC, WL, AP ONLY)

## 2016-02-09 LAB — LIPASE, BLOOD: Lipase: 21 U/L (ref 11–51)

## 2016-02-09 NOTE — ED Notes (Signed)
No answer when pt name called in the waiting room; unable to locate pt 

## 2016-02-09 NOTE — ED Notes (Signed)
No answer when pt's name called in the waiting room; unable to locate pt; pt eloped from the waiting room 

## 2016-02-09 NOTE — ED Notes (Signed)
Patient presents for LLQ abdominal pain onset earlier today, non radiating, 3 episodes of emesis today. Denies diarrhea, fever/chills. Rates pain 10/10.

## 2016-02-09 NOTE — ED Notes (Signed)
No answer when pt's name called in the lobby, unable to locate pt 

## 2016-05-24 ENCOUNTER — Emergency Department (HOSPITAL_BASED_OUTPATIENT_CLINIC_OR_DEPARTMENT_OTHER): Admit: 2016-05-24 | Discharge: 2016-05-24 | Disposition: A | Discharge status: Home / Self Care | Payer: 59

## 2016-05-24 ENCOUNTER — Emergency Department (HOSPITAL_COMMUNITY)
Admission: EM | Admit: 2016-05-24 | Discharge: 2016-05-24 | Disposition: A | Discharge status: Home / Self Care | Payer: 59 | Attending: Emergency Medicine | Admitting: Emergency Medicine

## 2016-05-24 ENCOUNTER — Encounter (HOSPITAL_COMMUNITY): Payer: Self-pay

## 2016-05-24 ENCOUNTER — Emergency Department (HOSPITAL_COMMUNITY): Payer: 59

## 2016-05-24 DIAGNOSIS — M79609 Pain in unspecified limb: Secondary | ICD-10-CM

## 2016-05-24 DIAGNOSIS — M79662 Pain in left lower leg: Secondary | ICD-10-CM | POA: Insufficient documentation

## 2016-05-24 DIAGNOSIS — E119 Type 2 diabetes mellitus without complications: Secondary | ICD-10-CM | POA: Insufficient documentation

## 2016-05-24 DIAGNOSIS — Z79899 Other long term (current) drug therapy: Secondary | ICD-10-CM | POA: Diagnosis not present

## 2016-05-24 DIAGNOSIS — M7989 Other specified soft tissue disorders: Secondary | ICD-10-CM | POA: Diagnosis present

## 2016-05-24 DIAGNOSIS — Z8719 Personal history of other diseases of the digestive system: Secondary | ICD-10-CM | POA: Insufficient documentation

## 2016-05-24 DIAGNOSIS — R600 Localized edema: Secondary | ICD-10-CM | POA: Diagnosis not present

## 2016-05-24 DIAGNOSIS — I10 Essential (primary) hypertension: Secondary | ICD-10-CM | POA: Insufficient documentation

## 2016-05-24 MED ORDER — KETOROLAC TROMETHAMINE 60 MG/2ML IM SOLN
60.0000 mg | Freq: Once | INTRAMUSCULAR | Status: AC
  Administered 2016-05-24: 60 mg via INTRAMUSCULAR
  Filled 2016-05-24: qty 2

## 2016-05-24 NOTE — Progress Notes (Signed)
VASCULAR LAB PRELIMINARY  PRELIMINARY  PRELIMINARY  PRELIMINARY  Left lower extremity venous duplex completed.    Preliminary report:  There is no DVT or SVT noted in the left lower extremity.   Mitesh Rosendahl, RVT 05/24/2016, 7:40 PM

## 2016-05-24 NOTE — ED Provider Notes (Signed)
CSN: [id number]     Arrival date & time 05/24/16  1622 History   First MD Initiated Contact with Patient 05/24/16 1754     Chief Complaint  Patient presents with  . Leg Swelling     (Consider location/radiation/quality/duration/timing/severity/associated sxs/prior Treatment) Patient is a 25 y.o. male presenting with leg pain.  Leg Pain Location:  Leg Injury: no   Leg location:  L lower leg Pain details:    Quality:  Aching   Radiates to:  Does not radiate   Severity:  Mild   Onset quality:  Gradual   Duration:  2 weeks   Timing:  Constant   Progression:  Worsening Chronicity:  New Foreign body present:  No foreign bodies Prior injury to area:  No Relieved by:  None tried Worsened by:  Nothing tried Ineffective treatments:  NSAIDs Associated symptoms: no back pain and no fever     Past Medical History  Diagnosis Date  . Diabetes mellitus without complication (HCC)   . Allergy   . Colitis   . Hypertension    History reviewed. No pertinent past surgical history. Family History  Problem Relation Age of Onset  . Hypertension Other   . Diabetes Other   . Alzheimer's disease Other    Social History  Substance Use Topics  . Smoking status: Never Smoker   . Smokeless tobacco: Never Used  . Alcohol Use: No   OB History    No data available     Review of Systems  Constitutional: Negative for fever and chills.  HENT: Negative for congestion and dental problem.   Respiratory: Negative for cough and shortness of breath.   Cardiovascular: Negative for chest pain.  Gastrointestinal: Negative for abdominal pain and diarrhea.  Musculoskeletal: Negative for back pain.  All other systems reviewed and are negative.     Allergies  Review of patient's allergies indicates no known allergies.  Home Medications   Prior to Admission medications   Medication Sig Start Date End Date Taking? Authorizing Provider  Garcinia Cambogia-Chromium 500-200 MG-MCG TABS Take 1  tablet by mouth daily.   Yes Historical Provider, MD  hydrochlorothiazide (HYDRODIURIL) 25 MG tablet Take 25 mg by mouth daily as needed (fluid).   Yes Historical Provider, MD  testosterone cypionate (DEPOTESTOSTERONE CYPIONATE) 200 MG/ML injection Inject 200 mg into the muscle every 14 (fourteen) days.  04/15/16  Yes Historical Provider, MD   BP 146/89 mmHg  Pulse 77  Temp(Src) 98.6 F (37 C) (Oral)  Resp 16  Ht  (1.575 m)  Wt 269 lb 6 oz (122.188 kg)  BMI 49.26 kg/m2  SpO2 98% Physical Exam  Constitutional: She is oriented to person, place, and time. She appears well-developed and well-nourished.  HENT:  Head: Normocephalic and atraumatic.  Neck: Normal range of motion.  Cardiovascular: Normal rate and regular rhythm.   Pulmonary/Chest: No stridor. No respiratory distress.  Abdominal: Soft. She exhibits no distension.  Musculoskeletal: She exhibits edema (lle).  Neurological: She is alert and oriented to person, place, and time.  Skin: Skin is warm and dry. No erythema.  Nursing note and vitals reviewed.   ED Course  Procedures (including critical care time) Labs Review Labs Reviewed - No data to display  Imaging Review Dg Foot 2 Views Left  05/24/2016  CLINICAL DATA:  Acute onset of left lateral foot pain and swelling. Initial encounter. EXAM: LEFT FOOT - 2 VIEW COMPARISON:  Left foot radiographs performed 10/25/2008 FINDINGS: There is no evidence of fracture  or dislocation. The joint spaces are preserved. There is no evidence of talar subluxation; the subtalar joint is unremarkable in appearance. No significant soft tissue abnormalities are seen. IMPRESSION: No evidence of fracture or dislocation. Electronically Signed   By: Roanna RaiderJeffery  Chang M.D.   On: 05/24/2016 21:27   I have personally reviewed and evaluated these images and lab results as part of my medical decision-making.   EKG Interpretation None      MDM   Final diagnoses:  Leg swelling    25 yo F w/ a  couple weeks of worsening left lower extremity swelling and pain. Negative for dvt. Exam not c/w cellulitis or nec fasc. Normal pulse, doubt arterial occlusion. xr negative for fx. Symptoms improved with toradol. Stable for dc with pcp follow up  New Prescriptions: New Prescriptions   No medications on file     I have personally and contemperaneously reviewed labs and imaging and used in my decision making as above.   A medical screening exam was performed and I feel the patient has had an appropriate workup for their chief complaint at this time and likelihood of emergent condition existing is low and thus workup can continue on an outpatient basis.. Their vital signs are stable. They have been counseled on decision, discharge, follow up and which symptoms necessitate immediate return to the emergency department.  They verbally stated understanding and agreement with plan and discharged in stable condition.      Marily MemosJason Ninah Moccio, MD 05/24/16 2203

## 2016-05-24 NOTE — ED Notes (Signed)
Vascular at bedside

## 2016-05-24 NOTE — ED Notes (Signed)
Patient here with almost 2 weeks of left lower leg pain and swelling. Denies trauma. Pain worse with ambulation. Sent from urgent care for further evaluation

## 2016-07-28 HISTORY — PX: TOTAL ABDOMINAL HYSTERECTOMY: SHX209

## 2016-08-28 HISTORY — PX: NIPPLE SPARING MASTECTOMY: SHX6537

## 2016-09-10 ENCOUNTER — Emergency Department (HOSPITAL_COMMUNITY)
Admission: EM | Admit: 2016-09-10 | Discharge: 2016-09-10 | Discharge status: Against Medical Advice | Payer: No Typology Code available for payment source

## 2018-09-20 ENCOUNTER — Telehealth: Payer: Self-pay | Admitting: Family Medicine

## 2018-09-20 NOTE — Telephone Encounter (Signed)
Called and spoke with pt to reschedule  appt with Dr. Clelia CroftShaw from 09/27/18. I was able to reschedule  to 11/28/18 with Dr. Clelia CroftShaw at 1:20 am. I advised of time, building number and late policy.

## 2018-09-27 ENCOUNTER — Ambulatory Visit: Payer: Self-pay | Admitting: Family Medicine

## 2018-11-28 ENCOUNTER — Ambulatory Visit: Payer: Self-pay | Admitting: Family Medicine

## 2019-01-05 ENCOUNTER — Ambulatory Visit: Payer: Self-pay | Admitting: Family Medicine

## 2019-02-24 ENCOUNTER — Other Ambulatory Visit: Payer: Self-pay

## 2019-02-24 ENCOUNTER — Encounter: Payer: Self-pay | Admitting: Family Medicine

## 2019-02-24 ENCOUNTER — Ambulatory Visit (INDEPENDENT_AMBULATORY_CARE_PROVIDER_SITE_OTHER): Payer: 59 | Admitting: Family Medicine

## 2019-02-24 VITALS — BP 114/74 | HR 89 | Temp 98.4°F | Resp 20 | Ht 63.78 in | Wt 285.6 lb

## 2019-02-24 DIAGNOSIS — Z113 Encounter for screening for infections with a predominantly sexual mode of transmission: Secondary | ICD-10-CM | POA: Diagnosis not present

## 2019-02-24 DIAGNOSIS — Z79899 Other long term (current) drug therapy: Secondary | ICD-10-CM | POA: Diagnosis not present

## 2019-02-24 DIAGNOSIS — Z6841 Body Mass Index (BMI) 40.0 and over, adult: Secondary | ICD-10-CM

## 2019-02-24 DIAGNOSIS — F640 Transsexualism: Secondary | ICD-10-CM

## 2019-02-24 DIAGNOSIS — Z789 Other specified health status: Secondary | ICD-10-CM

## 2019-02-24 MED ORDER — TESTOSTERONE CYPIONATE 200 MG/ML IM SOLN: 100.0000 mg | INTRAMUSCULAR | 1 refills | Status: AC

## 2019-02-24 MED ORDER — "NEEDLE (DISP) 18G X 1-1/2"" MISC": 1.0000 [IU] | 0 refills | Status: AC

## 2019-02-24 MED ORDER — "NEEDLE (DISP) 23G X 3/4"" MISC": 1.0000 [IU] | 0 refills | Status: AC | PRN

## 2019-02-24 MED ORDER — SYRINGE (DISPOSABLE) 1 ML MISC: 1.0000 [IU] | 0 refills | Status: AC | PRN

## 2019-02-24 NOTE — Progress Notes (Signed)
Subjective:    Patient: Mike Boyd  DOB: 04/27/1991; 28 y.o.   MRN: [id number]  Chief Complaint  Patient presents with  . Establish Care    HPI Previous pt of Dr. Ruby Cola.  Was on hormone therapy started around 2013. Was on pretty continuously with dose change after hysterectomy in 07/2016 done by Dr. Everlene Other at [facility] lowered to 0.59ml q2wks. - has never needed to be on weekly dosing - has felt stable on qowk.  Ran out of medication last Sept and has been off since. Does feel more fatigued but otherwise doing ok with mood.  Top surg 9/17  Trying to loose weight for bottom surgery but told goal is 210 lbs  No personal medical hx.  MGM DM and HTN PGM brain cancer Father with MI at 29.  Sexually attracted to women - no currently involved.   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  Medical History History reviewed. No pertinent past medical history. Past Surgical History:  Procedure Laterality Date  . ABDOMINAL HYSTERECTOMY    . BREAST SURGERY     No current outpatient medications on file prior to visit.   No current facility-administered medications on file prior to visit.    No Known Allergies History reviewed. No pertinent family history. Social History   Socioeconomic History  . Marital status: Single    Spouse name: Not on file  . Number of children: 0  . Years of education: Not on file  . Highest education level: Not on file  Occupational History  . Not on file  Social Needs  . Financial resource strain: Not on file  . Food insecurity:    Worry: Not on file    Inability: Not on file  . Transportation needs:    Medical: Not on file    Non-medical: Not on file  Tobacco Use  . Smoking status: Never Smoker  . Smokeless tobacco: Never Used  Substance and Sexual Activity  . Alcohol use: Yes    Alcohol/week: 2.0 standard drinks    Types: 2 Cans of beer per week  . Drug use: Never  . Sexual activity: Not Currently  Lifestyle  . Physical activity:     Days per week: Not on file    Minutes per session: Not on file  . Stress: Not on file  Relationships  . Social connections:    Talks on phone: Not on file    Gets together: Not on file    Attends religious service: Not on file    Active member of club or organization: Not on file    Attends meetings of clubs or organizations: Not on file    Relationship status: Not on file  Other Topics Concern  . Not on file  Social History Narrative  . Not on file   Depression screen [facility] 2/9 02/24/2019  Decreased Interest 0  Down, Depressed, Hopeless 0  PHQ - 2 Score 0    ROS As noted in HPI  Objective:  BP 114/74   Pulse 89   Temp 98.4 F (36.9 C) (Oral)   Resp 20   Ht 5' 3.78" (1.62 m)   Wt 285 lb 9.6 oz (129.5 kg)   SpO2 99%   BMI 49.36 kg/m  Physical Exam Constitutional:      General: He is not in acute distress.    Appearance: He is well-developed. He is not diaphoretic.  HENT:     Head: Normocephalic and atraumatic.     Right Ear:  External ear normal.     Left Ear: External ear normal.  Eyes:     General: No scleral icterus.    Conjunctiva/sclera: Conjunctivae normal.  Neck:     Musculoskeletal: Normal range of motion and neck supple.     Thyroid: No thyromegaly.  Cardiovascular:     Rate and Rhythm: Normal rate and regular rhythm.     Heart sounds: Normal heart sounds.  Pulmonary:     Effort: Pulmonary effort is normal. No respiratory distress.     Breath sounds: Normal breath sounds.  Lymphadenopathy:     Cervical: No cervical adenopathy.  Skin:    General: Skin is warm and dry.     Findings: No erythema.  Neurological:     Mental Status: He is alert and oriented to person, place, and time.  Psychiatric:        Behavior: Behavior normal.     POC TESTING Office Visit on 02/24/2019  Component Date Value Ref Range Status  . Cholesterol, Total 02/24/2019 121  100 - 199 mg/dL Final  . Triglycerides 02/24/2019 79  0 - 149 mg/dL Final  . HDL 29/56/213002/28/2020 46   >39 mg/dL Final  . VLDL Cholesterol Cal 02/24/2019 16  5 - 40 mg/dL Final  . LDL Calculated 02/24/2019 59  0 - 99 mg/dL Final  . Chol/HDL Ratio 02/24/2019 2.6  0.0 - 5.0 ratio Final   Comment:                                   T. Chol/HDL Ratio                                             Men  Women                               1/2 Avg.Risk  3.4    3.3                                   Avg.Risk  5.0    4.4                                2X Avg.Risk  9.6    7.1                                3X Avg.Risk 23.4   11.0   . Glucose 02/24/2019 110* 65 - 99 mg/dL Final  . BUN 86/57/846902/28/2020 10  6 - 20 mg/dL Final  . Creatinine, Ser 02/24/2019 0.86  0.76 - 1.27 mg/dL Final  . GFR calc non Af Amer 02/24/2019 117  >59 mL/min/1.73 Final  . GFR calc Af Amer 02/24/2019 135  >59 mL/min/1.73 Final  . BUN/Creatinine Ratio 02/24/2019 12  9 - 20 Final  . Sodium 02/24/2019 141  134 - 144 mmol/L Final  . Potassium 02/24/2019 4.7  3.5 - 5.2 mmol/L Final  . Chloride 02/24/2019 106  96 - 106 mmol/L Final  . CO2 02/24/2019 18* 20 - 29 mmol/L Final  . Calcium 02/24/2019 9.6  8.7 - 10.2 mg/dL Final  . Total Protein 02/24/2019 7.4  6.0 - 8.5 g/dL Final  . Albumin 88/41/6606 4.3  4.1 - 5.2 g/dL Final                 **Please note reference interval change**  . Globulin, Total 02/24/2019 3.1  1.5 - 4.5 g/dL Final  . Albumin/Globulin Ratio 02/24/2019 1.4  1.2 - 2.2 Final  . Bilirubin Total 02/24/2019 <0.2  0.0 - 1.2 mg/dL Final  . Alkaline Phosphatase 02/24/2019 64  39 - 117 IU/L Final  . AST 02/24/2019 18  0 - 40 IU/L Final  . ALT 02/24/2019 18  0 - 44 IU/L Final  . Hgb A1c MFr Bld 02/24/2019 5.6  4.8 - 5.6 % Final   Comment:          Prediabetes: 5.7 - 6.4          Diabetes: >6.4          Glycemic control for adults with diabetes: <7.0   . Est. average glucose Bld gHb Est-m* 02/24/2019 114  mg/dL Final  . TSH 30/16/0109 1.970  0.450 - 4.500 uIU/mL Final     Assessment & Plan:   1. Long-term use of high-risk  medication   2. Routine screening for STI (sexually transmitted infection)   3. Male-to-male transgender person - impt for pt to stay on hormones as s/p oophorectomy - increased risk of osteoporosis if off and ran out about 6 mos ago.  Restart prior dose of testosterone 0.60mL (=100mg ) IM qowk and recheck w/ me in 2-3 mos with labs 1 wk prior.  4. Class 3 severe obesity due to excess calories without serious comorbidity with body mass index (BMI) of 45.0 to 49.9 in adult [facility])     Patient will continue on current chronic medications other than changes noted above, so ok to refill when needed.   See after visit summary for patient specific instructions.  Orders Placed This Encounter  Procedures  . GC/Chlamydia Probe Amp  . Lipid panel    Order Specific Question:   Has the patient fasted?    Answer:   Yes  . Comprehensive metabolic panel    Order Specific Question:   Has the patient fasted?    Answer:   Yes  . Hemoglobin A1c  . TSH  . HIV Antibody (routine testing w rflx)  . RPR  . HCV Ab w/Rflx to Verification    Meds ordered this encounter  Medications  . testosterone cypionate (DEPOTESTOSTERONE CYPIONATE) 200 MG/ML injection    Sig: Inject 0.5 mLs (100 mg total) into the muscle every 14 (fourteen) days.    Dispense:  6 mL    Refill:  1    Pt needs to use new vial for each injection so 6 vials= 6 doses = 3 mo supply  . Syringe, Disposable, 1 ML MISC    Sig: 1 Units by Does not apply route as needed.    Dispense:  60 each    Refill:  0  . NEEDLE, DISP, 18 G 18G X 1-1/2" MISC    Sig: 1 Units by Does not apply route as directed.    Dispense:  100 each    Refill:  0  . NEEDLE, DISP, 23 G 23G X 3/4" MISC    Sig: 1 Units by Does not apply route as needed.    Dispense:  100 each    Refill:  0    Patient verbalized to  me that they understand the following: diagnosis, what is being done for them, what to expect and what should be done at home.  Their questions have been  answered. They understand that I am unable to predict every possible medication interaction or adverse outcome and that if any unexpected symptoms arise, they should contact us and their pharmacist, as well as never hesitate to seek urgent/emergent care at [facility] or ER if they think it might be warranted.    Norberto Sorenson, MD, MPH [facility] [address] [place], [place]  [address] [phone] Office phone  [phone] Office fax  02/24/19 8:45 AM

## 2019-02-25 LAB — HEMOGLOBIN A1C
Est. average glucose Bld gHb Est-mCnc: 114 mg/dL
Hgb A1c MFr Bld: 5.6 % (ref 4.8–5.6)

## 2019-02-25 LAB — COMPREHENSIVE METABOLIC PANEL
ALT: 18 IU/L (ref 0–44)
AST: 18 IU/L (ref 0–40)
Albumin/Globulin Ratio: 1.4 (ref 1.2–2.2)
Albumin: 4.3 g/dL (ref 4.1–5.2)
Alkaline Phosphatase: 64 IU/L (ref 39–117)
BUN/Creatinine Ratio: 12 (ref 9–20)
BUN: 10 mg/dL (ref 6–20)
Bilirubin Total: <0.2 mg/dL (ref 0.0–1.2)
CO2: 18 mmol/L — ABNORMAL LOW (ref 20–29)
Calcium: 9.6 mg/dL (ref 8.7–10.2)
Chloride: 106 mmol/L (ref 96–106)
Creatinine, Ser: 0.86 mg/dL (ref 0.76–1.27)
GFR calc Af Amer: 135 mL/min/{1.73_m2} (ref >59)
GFR calc non Af Amer: 117 mL/min/{1.73_m2} (ref >59)
Globulin, Total: 3.1 g/dL (ref 1.5–4.5)
Glucose: 110 mg/dL — ABNORMAL HIGH (ref 65–99)
Potassium: 4.7 mmol/L (ref 3.5–5.2)
Sodium: 141 mmol/L (ref 134–144)
Total Protein: 7.4 g/dL (ref 6.0–8.5)

## 2019-02-25 LAB — LIPID PANEL
Chol/HDL Ratio: 2.6 ratio (ref 0.0–5.0)
Cholesterol, Total: 121 mg/dL (ref 100–199)
HDL: 46 mg/dL (ref >39)
LDL Calculated: 59 mg/dL (ref 0–99)
Triglycerides: 79 mg/dL (ref 0–149)
VLDL Cholesterol Cal: 16 mg/dL (ref 5–40)

## 2019-02-25 LAB — TSH: TSH: 1.97 u[IU]/mL (ref 0.450–4.500)

## 2019-03-04 ENCOUNTER — Encounter: Payer: Self-pay | Admitting: Family Medicine

## 2019-03-04 DIAGNOSIS — F640 Transsexualism: Secondary | ICD-10-CM | POA: Insufficient documentation

## 2019-03-04 DIAGNOSIS — Z6841 Body Mass Index (BMI) 40.0 and over, adult: Secondary | ICD-10-CM

## 2019-03-04 DIAGNOSIS — Z789 Other specified health status: Secondary | ICD-10-CM | POA: Insufficient documentation

## 2019-03-06 ENCOUNTER — Encounter: Payer: Self-pay | Admitting: Radiology

## 2020-10-26 ENCOUNTER — Emergency Department (HOSPITAL_COMMUNITY): Payer: 59

## 2020-10-26 ENCOUNTER — Encounter (HOSPITAL_COMMUNITY): Payer: Self-pay | Admitting: *Deleted

## 2020-10-26 ENCOUNTER — Emergency Department (HOSPITAL_COMMUNITY)
Admission: EM | Admit: 2020-10-26 | Discharge: 2020-10-26 | Disposition: A | Discharge status: Home / Self Care | Payer: 59 | Attending: Emergency Medicine | Admitting: Emergency Medicine

## 2020-10-26 ENCOUNTER — Other Ambulatory Visit: Payer: Self-pay

## 2020-10-26 DIAGNOSIS — M545 Low back pain, unspecified: Secondary | ICD-10-CM | POA: Diagnosis present

## 2020-10-26 DIAGNOSIS — M25512 Pain in left shoulder: Secondary | ICD-10-CM | POA: Diagnosis not present

## 2020-10-26 DIAGNOSIS — Y92410 Unspecified street and highway as the place of occurrence of the external cause: Secondary | ICD-10-CM | POA: Diagnosis not present

## 2020-10-26 DIAGNOSIS — R10814 Left lower quadrant abdominal tenderness: Secondary | ICD-10-CM | POA: Insufficient documentation

## 2020-10-26 DIAGNOSIS — R1084 Generalized abdominal pain: Secondary | ICD-10-CM | POA: Insufficient documentation

## 2020-10-26 DIAGNOSIS — M25511 Pain in right shoulder: Secondary | ICD-10-CM

## 2020-10-26 LAB — URINALYSIS, ROUTINE W REFLEX MICROSCOPIC
Bilirubin Urine: NEGATIVE
Glucose, UA: NEGATIVE mg/dL
Hgb urine dipstick: NEGATIVE
Ketones, ur: 5 mg/dL — AB
Leukocytes,Ua: NEGATIVE
Nitrite: NEGATIVE
Protein, ur: NEGATIVE mg/dL
Specific Gravity, Urine: >1.046 — ABNORMAL HIGH (ref 1.005–1.030)
pH: 6 (ref 5.0–8.0)

## 2020-10-26 LAB — COMPREHENSIVE METABOLIC PANEL
ALT: 15 U/L (ref 0–44)
AST: 18 U/L (ref 15–41)
Albumin: 4.4 g/dL (ref 3.5–5.0)
Alkaline Phosphatase: 55 U/L (ref 38–126)
Anion gap: 10 (ref 5–15)
BUN: 8 mg/dL (ref 6–20)
CO2: 26 mmol/L (ref 22–32)
Calcium: 9.3 mg/dL (ref 8.9–10.3)
Chloride: 103 mmol/L (ref 98–111)
Creatinine, Ser: 1.05 mg/dL (ref 0.61–1.24)
GFR, Estimated: >60 mL/min (ref >60)
Glucose, Bld: 88 mg/dL (ref 70–99)
Potassium: 3.7 mmol/L (ref 3.5–5.1)
Sodium: 139 mmol/L (ref 135–145)
Total Bilirubin: 0.6 mg/dL (ref 0.3–1.2)
Total Protein: 8.1 g/dL (ref 6.5–8.1)

## 2020-10-26 LAB — CBC WITH DIFFERENTIAL/PLATELET
Abs Immature Granulocytes: 0 10*3/uL (ref 0.00–0.07)
Basophils Absolute: 0 10*3/uL (ref 0.0–0.1)
Basophils Relative: 0 %
Eosinophils Absolute: 0.1 10*3/uL (ref 0.0–0.5)
Eosinophils Relative: 1 %
HCT: 41.8 % (ref 39.0–52.0)
Hemoglobin: 13.5 g/dL (ref 13.0–17.0)
Immature Granulocytes: 0 %
Lymphocytes Relative: 50 %
Lymphs Abs: 2.6 10*3/uL (ref 0.7–4.0)
MCH: 26.3 pg (ref 26.0–34.0)
MCHC: 32.3 g/dL (ref 30.0–36.0)
MCV: 81.5 fL (ref 80.0–100.0)
Monocytes Absolute: 0.4 10*3/uL (ref 0.1–1.0)
Monocytes Relative: 7 %
Neutro Abs: 2.2 10*3/uL (ref 1.7–7.7)
Neutrophils Relative %: 42 %
Platelets: 305 10*3/uL (ref 150–400)
RBC: 5.13 MIL/uL (ref 4.22–5.81)
RDW: 15.3 % (ref 11.5–15.5)
WBC: 5.3 10*3/uL (ref 4.0–10.5)
nRBC: 0 % (ref 0.0–0.2)

## 2020-10-26 LAB — LIPASE, BLOOD: Lipase: 37 U/L (ref 11–51)

## 2020-10-26 MED ORDER — IOHEXOL 300 MG/ML  SOLN
100.0000 mL | Freq: Once | INTRAMUSCULAR | Status: AC | PRN
  Administered 2020-10-26: 100 mL via INTRAVENOUS

## 2020-10-26 MED ORDER — METHOCARBAMOL 500 MG PO TABS: 500.0000 mg | ORAL_TABLET | Freq: Two times a day (BID) | ORAL | 0 refills | Status: DC

## 2020-10-26 NOTE — ED Notes (Signed)
Unsuccessful IV attempt. Charge nurse notified to attempt ultrasound IV.
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
## 2020-11-18 ENCOUNTER — Encounter (HOSPITAL_COMMUNITY): Payer: Self-pay
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
## 2022-08-26 ENCOUNTER — Emergency Department (HOSPITAL_COMMUNITY)
Admission: EM | Admit: 2022-08-26 | Discharge: 2022-08-26 | Disposition: A | Discharge status: Home / Self Care | Payer: 59 | Attending: Emergency Medicine | Admitting: Emergency Medicine

## 2022-08-26 ENCOUNTER — Other Ambulatory Visit: Payer: Self-pay

## 2022-08-26 ENCOUNTER — Encounter (HOSPITAL_COMMUNITY): Payer: Self-pay

## 2022-08-26 DIAGNOSIS — N99114 Postprocedural urethral stricture, male, unspecified: Secondary | ICD-10-CM

## 2022-08-26 DIAGNOSIS — N99112 Postprocedural membranous urethral stricture: Secondary | ICD-10-CM | POA: Diagnosis not present

## 2022-08-26 DIAGNOSIS — R339 Retention of urine, unspecified: Secondary | ICD-10-CM | POA: Diagnosis present

## 2022-08-26 LAB — BASIC METABOLIC PANEL
Anion gap: 4 — ABNORMAL LOW (ref 5–15)
BUN: 12 mg/dL (ref 6–20)
CO2: 24 mmol/L (ref 22–32)
Calcium: 9.3 mg/dL (ref 8.9–10.3)
Chloride: 112 mmol/L — ABNORMAL HIGH (ref 98–111)
Creatinine, Ser: 1.14 mg/dL (ref 0.61–1.24)
GFR, Estimated: >60 mL/min (ref >60)
Glucose, Bld: 84 mg/dL (ref 70–99)
Potassium: 4.2 mmol/L (ref 3.5–5.1)
Sodium: 140 mmol/L (ref 135–145)

## 2022-08-26 LAB — URINALYSIS, ROUTINE W REFLEX MICROSCOPIC
Bacteria, UA: NONE SEEN
Bilirubin Urine: NEGATIVE
Glucose, UA: NEGATIVE mg/dL
Ketones, ur: NEGATIVE mg/dL
Leukocytes,Ua: NEGATIVE
Nitrite: NEGATIVE
Protein, ur: NEGATIVE mg/dL
RBC / HPF: >50 RBC/hpf — ABNORMAL HIGH (ref 0–5)
Specific Gravity, Urine: 1.011 (ref 1.005–1.030)
pH: 7 (ref 5.0–8.0)

## 2022-08-26 MED ORDER — CEPHALEXIN 500 MG PO CAPS: 500.0000 mg | ORAL_CAPSULE | Freq: Four times a day (QID) | ORAL | 0 refills | Status: AC

## 2022-08-26 NOTE — ED Notes (Signed)
Leg bag applied to foley. (Foley in place by urologist at bedside).

## 2022-08-26 NOTE — ED Provider Notes (Signed)
[facility] DEPT Provider Note   CSN: [id number] Arrival date & time: 08/26/22  1020     History  Chief Complaint  Patient presents with   Urinary Retention    Mike Boyd is a 31 y.o. adult.  31 year old transgender male to male status post phalloplasty part 1 in 2022 with part 2 on 03/2022 who presents emergency department with urinary retention.  Patient states that in July he had urinary retention that required urethral dilation for strictures that were found.  Had a Foley catheter in from 7/3 to 7/10.  Says that the Foley was removed and was doing well until several days ago when he started noticing urinary hesitancy.  Says that he also started having suprapubic discomfort.  No any fevers, hematuria, dysuria, or frequency otherwise.  Said that in the past 24 hours the urinary retention had worsened and just prior to being seen had an episode where he felt like his bladder overflowed and involuntarily urinated on the floor.         Home Medications Prior to Admission medications   Medication Sig Start Date End Date Taking? Authorizing Provider  acetaminophen (TYLENOL) 500 MG tablet Take 1,000 mg by mouth every 6 (six) hours as needed for mild pain.   Yes [provider]  cephALEXin (KEFLEX) 500 MG capsule Take 1 capsule (500 mg total) by mouth 4 (four) times daily for 7 days. 08/26/22 09/02/22 Yes Fransico Meadow, MD  Lactobacillus (PROBIOTIC ACIDOPHILUS) CAPS Take 1 capsule by mouth daily.   Yes [provider]  LOMAIRA 8 MG TABS Take 1 tablet by mouth daily. 08/07/22  Yes [provider]  Multiple Vitamin (MULTIVITAMIN) capsule Take 1 capsule by mouth daily.   Yes [provider]  testosterone cypionate (DEPOTESTOSTERONE CYPIONATE) 200 MG/ML injection Inject 0.5 mLs (100 mg total) into the muscle every 14 (fourteen) days. 02/24/19  Yes Shawnee Knapp, MD  topiramate (TOPAMAX) 50 MG tablet Take 50 mg by mouth  daily. 08/08/22  Yes [provider]  NEEDLE, DISP, 18 G 18G X 1-1/2" MISC 1 Units by Does not apply route as directed. 02/24/19   Shawnee Knapp, MD  NEEDLE, DISP, 23 G 23G X 3/4" MISC 1 Units by Does not apply route as needed. 02/24/19   Shawnee Knapp, MD  Syringe, Disposable, 1 ML MISC 1 Units by Does not apply route as needed. 02/24/19   Shawnee Knapp, MD      Allergies    Patient has no known allergies.    Review of Systems   Review of Systems  Physical Exam Updated Vital Signs BP 126/81   Pulse 70   Temp 97.9 F (36.6 C)   Resp 17   Ht '5\' 5"'  (1.651 m)   Wt 93.9 kg   LMP 01/23/2016   SpO2 100%   BMI 34.45 kg/m  Physical Exam Vitals and nursing note reviewed.  Constitutional:      General: He is not in acute distress.    Appearance: He is well-developed.  HENT:     Head: Normocephalic and atraumatic.     Right Ear: External ear normal.     Left Ear: External ear normal.     Nose: Nose normal.  Eyes:     Extraocular Movements: Extraocular movements intact.     Conjunctiva/sclera: Conjunctivae normal.     Pupils: Pupils are equal, round, and reactive to light.  Pulmonary:     Effort: Pulmonary effort is normal.  No respiratory distress.  Abdominal:     General: There is no distension.     Palpations: Abdomen is soft. There is no mass.     Tenderness: There is abdominal tenderness (Suprapubic). There is no guarding.  Musculoskeletal:        General: No swelling.     Cervical back: Normal range of motion and neck supple.     Right lower leg: No edema.     Left lower leg: No edema.  Skin:    General: Skin is warm and dry.     Capillary Refill: Capillary refill takes less than 2 seconds.  Neurological:     Mental Status: He is alert. Mental status is at baseline.  Psychiatric:        Mood and Affect: Mood normal.        Behavior: Behavior normal.     ED Results / Procedures / Treatments   Labs (all labs ordered are listed, but only abnormal results are  displayed) Labs Reviewed  URINALYSIS, ROUTINE W REFLEX MICROSCOPIC - Abnormal; Notable for the following components:      Result Value   Color, Urine STRAW (*)    Hgb urine dipstick LARGE (*)    RBC / HPF >50 (*)    All other components within normal limits  BASIC METABOLIC PANEL - Abnormal; Notable for the following components:   Chloride 112 (*)    Anion gap 4 (*)    All other components within normal limits  URINE CULTURE    EKG None  Radiology No results found.  Procedures Procedures   EMERGENCY DEPARTMENT ULTRASOUND  Study: Limited Ultrasound of Bladder  INDICATIONS: to assess for urinary retention and/or bladder volume prior to urinary catheter Multiple views of the bladder were obtained in real-time in the transverse and longitudinal planes with a multi-frequency probe.  PERFORMED BY: Myself IMAGES ARCHIVED?: No LIMITATIONS:  None INTERPRETATION: Volume Measurement 430 ml postvoid   Medications Ordered in ED Medications - No data to display  ED Course/ Medical Decision Making/ A&P Clinical Course as of 08/26/22 1941  Wed Aug 26, 2022  1155 Spoke with Dr Alinda Money from urology who requested a 77 French Foley catheter be placed and for him to follow-up with urology at [facility].  If unsuccessful recommends reaching back out. [RP]  0960 Unable to pass foley without resistance. Will consult urology.  [RP]  1605 Dr Alinda Money was able to perform cystoscopy with urethral dilation and placement of a Foley catheter.  Recommends sending the patient home on prophylactic Keflex and following up with [facility].  [RP]    Clinical Course User Index [RP] Fransico Meadow, MD                           Medical Decision Making Amount and/or Complexity of Data Reviewed Labs: ordered.  Risk Prescription drug management.   31 year old transgender male to male status post phalloplasty part 1 in 2022 with part 2 on 03/2022 who presents emergency department with urinary retention.     Initial Ddx:  Urethral stricture, UTI, phalloplasty complication  MDM:  Though the patient likely has a urethral stricture given the history of urethral stricture that required dilation.  Is retaining urine and did have episode of overflow incontinence at this time.  Will check labs to ensure the patient does not have an AKI and attempt to place a Foley after discussing with urology.  ED Summary:  Patient was discussed  with urology who recommended placement of a 57 French catheter which was unable to be performed due to resistance that was met.  Attempts were stopped and urology came to the emergency department and perform cystoscopy (which showed proximal stricture) and Foley placement via guidewire. They also performed dilation of the stricture.  They recommended that the patient follow-up with his outpatient urologist who performed the surgeries and leave the Foley catheter in place.  They also recommended that he be sent home with prophylactic antibiotics that he was given Keflex at discharge.  Urinalysis and urine culture were sent prior to discharge.  Dispo: DC Home. Return precautions discussed including, but not limited to, those listed in the AVS. Allowed pt time to ask questions which were answered fully prior to dc.   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

## 2022-08-26 NOTE — Discharge Instructions (Signed)
Today you were seen in the emergency department for your difficulty urinating.    In the emergency department you were seen by urology who placed a Foley catheter.    At home, please continue to care for the catheter as listed in instructions.  Take the antibiotics that we have given you for a week to prevent infection.    Check your MyChart online for the results of any tests that had not resulted by the time you left the emergency department.   Follow-up with your urologist as soon as possible at [facility] regarding your symptoms and any additional follow-up.  Return immediately to the emergency department if you experience any of the following: Fevers, flank pain, bloody urine, or any other concerning symptoms.    Thank you for visiting our Emergency Department. It was a pleasure taking care of you today.

## 2022-08-26 NOTE — ED Notes (Signed)
Bladder scanned pt, pt retains 425 mL of urine in bladder.

## 2022-08-26 NOTE — Consult Note (Signed)
Urology Consult   Physician requesting consult: Dr. Margaretmary Eddy  Reason for consult: Urinary retention and difficult urethral catheterization  History of Present Illness: Mike Boyd is a 31 y.o. male to male transgender patient who is s/p 2nd stage phalloplasty at [place] in April.  He has had subsequent difficulties with urinary retention and urethral stricture disease.  In fact, he is apparently tentatively scheduled for a urethroplasty at [facility] in October.  After his most recent endoscopic procedure in July, he has had progressive difficulty voiding until he developed complete urinary retention earlier today.  He presented the emergency department and an attempted placement of a 14 French catheter via his neourethra was unsuccessful.  Urology consultation was obtained.   Past Medical History:  Diagnosis Date   Allergy    Colitis    Diabetes mellitus without complication ([place])    Hypertension     Past Surgical History:  Procedure Laterality Date   LAPAROSCOPIC GASTRIC SLEEVE RESECTION     NIPPLE SPARING MASTECTOMY Bilateral 08/2016   TOTAL ABDOMINAL HYSTERECTOMY Bilateral 07/2016   with BSO done by Dr. Illene Bolus at [facility]    Medications:  Home meds:  No current facility-administered medications on file prior to encounter.   Current Outpatient Medications on File Prior to Encounter  Medication Sig Dispense Refill   acetaminophen (TYLENOL) 500 MG tablet Take 1,000 mg by mouth every 6 (six) hours as needed for mild pain.     Lactobacillus (PROBIOTIC ACIDOPHILUS) CAPS Take 1 capsule by mouth daily.     LOMAIRA 8 MG TABS Take 1 tablet by mouth daily.     Multiple Vitamin (MULTIVITAMIN) capsule Take 1 capsule by mouth daily.     testosterone cypionate (DEPOTESTOSTERONE CYPIONATE) 200 MG/ML injection Inject 0.5 mLs (100 mg total) into the muscle every 14 (fourteen) days. 6 mL 1   topiramate (TOPAMAX) 50 MG tablet Take 50 mg by mouth daily.     NEEDLE, DISP,  18 G 18G X 1-1/2" MISC 1 Units by Does not apply route as directed. 100 each 0   NEEDLE, DISP, 23 G 23G X 3/4" MISC 1 Units by Does not apply route as needed. 100 each 0   Syringe, Disposable, 1 ML MISC 1 Units by Does not apply route as needed. 60 each 0     Scheduled Meds: Continuous Infusions: PRN Meds:.  Allergies: No Known Allergies  Family History  Problem Relation Age of Onset   Heart disease Father 54       MI   Hypertension Maternal Grandmother    Hyperlipidemia Maternal Grandmother    Diabetes Maternal Grandmother    Stroke Maternal Grandmother    Cancer Paternal Grandmother        brain   Hypertension Other    Diabetes Other    Alzheimer's disease Other     Social History:  reports that he has never smoked. He has never used smokeless tobacco. He reports current alcohol use of about 2.0 standard drinks of alcohol per week. He reports that he does not use drugs.  ROS: A complete review of systems was performed.  All systems are negative except for pertinent findings as noted.  Physical Exam:  Vital signs in last 24 hours: Temp:  [98.7 F (37.1 C)] 98.7 F (37.1 C) (08/30 1045) Pulse Rate:  [67-91] 82 (08/30 1500) Resp:  [12-14] 14 (08/30 1500) BP: (110-137)/(68-99) 137/99 (08/30 1500) SpO2:  [89 %-100 %] 89 % (08/30 1500) Weight:  [93.9 kg] 93.9  kg (08/30 1045) Constitutional:  Alert and oriented, No acute distress Cardiovascular: Regular rate and rhythm, No JVD Respiratory: Normal respiratory effort GI: Mild suprapubic distention and tenderness Genitourinary: There is a well-healed neophallus within normal urethral meatus. Lymphatic: No lymphadenopathy Neurologic: Grossly intact, no focal deficits Psychiatric: Normal mood and affect  Laboratory Data:  No results for input(s): "WBC", "HGB", "HCT", "PLT" in the last 72 hours.  Recent Labs    08/26/22 1425  NA 140  K 4.2  CL 112*  GLUCOSE 84  BUN 12  CALCIUM 9.3  CREATININE 1.14     Results  for orders placed or performed during the hospital encounter of 08/26/22 (from the past 24 hour(s))  Basic metabolic panel     Status: Abnormal   Collection Time: 08/26/22  2:25 PM  Result Value Ref Range   Sodium 140 135 - 145 mmol/L   Potassium 4.2 3.5 - 5.1 mmol/L   Chloride 112 (H) 98 - 111 mmol/L   CO2 24 22 - 32 mmol/L   Glucose, Bld 84 70 - 99 mg/dL   BUN 12 6 - 20 mg/dL   Creatinine, Ser 1.14 0.61 - 1.24 mg/dL   Calcium 9.3 8.9 - 10.3 mg/dL   GFR, Estimated >60 >60 mL/min   Anion gap 4 (L) 5 - 15   No results found for this or any previous visit (from the past 240 hour(s)).  Renal Function: Recent Labs    08/26/22 1425  CREATININE 1.14   Estimated Creatinine Clearance (by C-G formula based on SCr of 1.14 mg/dL) Male: 79.6 mL/min Male: 97.1 mL/min  Procedures: Under sterile conditions, an attempt was made to place a 14 French Foley catheter.  Resistance was met proximally.  Flexible cystourethroscopy was then performed which demonstrated a dense proximal urethral stricture in the penile urethra.  A 0.38 sensor guidewire was able to be advanced through this stricture and an attempt was made to inflate this with a 24 [place] Ultraxx dilating balloon.  Attempts to place a 16 [place] council tip catheter over the wire were then still unsuccessful.  Reinspection of the urethra with the flexible cystoscope indicated that the previously noted stricture was able to be navigated but there was a false passage noted proximally near the bladder neck that required anterior advancement of the cystoscope to enter the bladder.  There is no evidence of any bladder tumors or other abnormalities.  A 0.38 sensor guidewire was then replaced within the bladder under direct vision and the cystoscope was removed.  A 16 French council tip catheter was able to be passed in the bladder with return of grossly clear urine.  Approximately 900 cc of urine was obtained.  Impression/Recommendation 1.  Urinary  retention/urethral stricture: The patient's catheter should be left indwelling.  Urine culture should be obtained and prophylactic antibiotics administered.  The patient will contact his primary reconstructive urologist, Dr. Wilma Flavin, at the [facility] for further instructions.  Dutch Gray 08/26/2022, 4:16 PM    Pryor Curia MD  CC: Dr. Margaretmary Eddy

## 2022-08-26 NOTE — ED Notes (Signed)
Attempted to insert 14 french foley catheter using sterile technique unsuccessfully. Resistance met, provider informed. Patient tolerated well. No signs of bleeding or trauma

## 2022-08-26 NOTE — ED Triage Notes (Signed)
Pt arrived via POV, states hx of urinary stricture, had surgery in July. Denies any issues until yesterday, started with some urinary rentention.

## 2022-08-27 LAB — URINE CULTURE: Culture: 20000 — AB

## 2022-08-28 ENCOUNTER — Telehealth: Payer: Self-pay | Admitting: *Deleted

## 2022-08-28 NOTE — Telephone Encounter (Signed)
Post ED Visit - Positive Culture Follow-up  Culture report reviewed by antimicrobial stewardship pharmacist: [facility] []  , Pharm.D. []  Enzo Bi, Pharm.D., BCPS AQ-ID []  , Pharm.D., BCPS [x]  Celedonio Miyamoto, .D., BCPS []  [place], .D., BCPS, AAHIVP []  Georgina Pillion, Pharm.D., BCPS, AAHIVP []  [address], PharmD, BCPS []  , PharmD, BCPS []  [place], PharmD, BCPS []  [place], PharmD []  , PharmD, BCPS []  Estella Husk, PharmD  Pharmacy Team []  Lysle Pearl, PharmD []  , PharmD []  Phillips Climes, PharmD []  , Rph []  Agapito Games) , PharmD []  Verlan Friends, PharmD []  , PharmD []  Mervyn Gay, PharmD []  , PharmD []  Vinnie Level, PharmD []  [facility], PharmD []  , PharmD []  Len Childs, PharmD   Positive urine culture Treated with Cephalexin, organism sensitive to the same and no further patient follow-up is required at this time.  [facility] 08/28/2022, 10:43 AM

## 2022-11-26 ENCOUNTER — Ambulatory Visit (INDEPENDENT_AMBULATORY_CARE_PROVIDER_SITE_OTHER): Payer: 59 | Admitting: Psychology

## 2022-11-26 ENCOUNTER — Encounter: Payer: Self-pay | Admitting: Psychology

## 2022-11-26 DIAGNOSIS — F411 Generalized anxiety disorder: Secondary | ICD-10-CM

## 2022-11-26 DIAGNOSIS — F4010 Social phobia, unspecified: Secondary | ICD-10-CM

## 2022-11-26 NOTE — Progress Notes (Signed)
[place] Counselor Initial Adult Exam  Name: Mike Boyd Date: 11/26/2022 MRN: [id number] DOB: Sep 14, 1991 PCP: Mancel Bale, PA-C  Time spent: 3-3:45 pm  Guardian/Informant:  Etter Sjogren - patient    Paperwork requested: No  Met with patient for initial interview.  Patient was at home and session was conducted from therapist's office via video conferencing.  Patient verbally consented to telehealth.    Reason for Visit /Presenting Problem: Patient reported having difficulty with learning, communicating, and interacting with people.  Patient always wondered about whether he has ASD.  Teachers suggested to parents about getting patient tested for extra helped but parents declined.   Has trouble communicating with people at work and wants to understand himself better.  Wanted to get tested previous but had trouble accessing services.  Struggling more with adult responsibilities.    Mental Status Exam: Appearance:   Neat and Well Groomed     Behavior:  Appropriate  Motor:  Normal  Speech/Language:   Clear and Coherent and Normal Rate  Affect:  Appropriate  Mood:  euthymic  Thought process:  normal  Thought content:    WNL  Sensory/Perceptual disturbances:    WNL  Orientation:  oriented to person, place, time/date, and situation  Attention:  Good  Concentration:  Good  Memory:  WNL  Fund of knowledge:   Fair  Insight:    Fair  Judgment:   Good  Impulse Control:  Good   Developmental History: Early delays - Pt. Not aware of delays but elementary school wanted to hold him back in 2nd grade due to lack of progress.  Parents argued against it.   Motor - Good coordination no sports or physical activity.  Good handwriting.  Struggles with shoe tying mostly wears slip on shoes.    Speech - Struggles with expressing thoughts clearly.   Self Care - Good with self-care Independent - Good Social - Stays mostly to himself.  Has two friends but doesn't speak to  them often.  He gets the most companionship from his dogs.    Reported Symptoms:  Trouble falling asleep.  Takes ZZZquil to sleep otherwise would stay up most of the night.  No changes in appetite.  Good energy during the day.  Previous episodes of depression but not current.  Last time was end 2022 - beginning of 2023.  No panic.  Anxious while driving and going outside in general.  Anywhere near people.  Has general worry as well as social anxiety.  Obsessive thought - understanding work tasks (if interpreted it correctly).  Compulsive with work - excessive checking to make sure numbers are correct.  Pushes self too hard at work.  Doesn't regulate work behavior.  Some trouble paying attention, easily distracted, frequent losing and forgetting, struggles with organization.  Interrupts others, impulsive behavior.  Trouble relating to most peers.  Struggles but improving with interpreting nonverbal cues.  Two friends but doesn't see them often.  In a dating relationship for the first time in 11-12 years.  No repetitive speech or behavior.  Overly intense interests - action figures, Luther Parody, dogs. Struggles with change and transition.  Sensitive to lights, sounds, smells.  Prefers staying in the dark.             Risk Assessment: Danger to Self:  No Self-injurious Behavior: No Danger to Others: No Duty to Warn:no Physical Aggression / Violence:No  Access to Firearms a concern: No  Gang Involvement:No  Patient / guardian was educated  about steps to take if suicide or homicide risk level increases between visits: n/a While future psychiatric events cannot be accurately predicted, the patient does not currently require acute inpatient psychiatric care and does not currently meet [facility] involuntary commitment criteria.  Substance Abuse History: Current substance abuse: No     Past Psychiatric History:   Psychological history significant for depression.   Outpatient Providers:Previously  Seen a therapist related to depression.    History of Psych Hospitalization: No  Psychological Testing:  None    Abuse History: Victim of: No.,    Report needed: No. Victim of Neglect:No. Perpetrator of  None   Witness / Exposure to Domestic Violence: No   Protective Services Involvement: No  Witness to [employer] Violence:  No   Family History:  Family History  Problem Relation Age of Onset   Heart disease Father 46       MI   Hypertension Maternal Grandmother    Hyperlipidemia Maternal Grandmother    Diabetes Maternal Grandmother    Stroke Maternal Grandmother    Cancer Paternal Grandmother        brain   Hypertension Other    Diabetes Other    Alzheimer's disease Other     Living situation: the patient lives alone with 2 dogs.  Speaks with mother daily but have distant relationship.  Was close to father but he passed away a few years ago.  Has two brothers - close to one brother but distant/cordial to the other.     Sexual Orientation: Straight  Relationship Status: single  Name of spouse / other:Kai - been dating less than a year.  Tries to explain his issues to her.  She feels like he doesn't understand her or her needs. Patient gets frustrated instead of calmly trying to understand the situation.  He gets frustrated when he cannot communicate his feelings effectively.    If a parent, number of children / ages:None  Support Systems: lives alone Oldest brother and cousin.    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  Longest job he has had.  Struggles when changing departments (struggles learning new information during training), to the point of getting written up but eventually adapts and does well on the job.,  Currently being asked to train others and pt. Has trouble explaining procedures to others.  He would prefer to work alone.  Fears transitioning  to a new department or getting promoted.  Doesn't know what questions to ask to get help.       Military Service: No   Educational History: Education: high school diploma/GED Struggled academically during high school, transferred to a smaller school during 10th grade that had more individual attention and was able to graduate.  Had one friend during high school and one from elementary school. Is still friends with both.  Not social otherwise.       Recreation/Hobbies: Go to movies or spend time with dogs.    Stressors: Occupational concerns    Strengths: Identifying problems and taking accountability, even can't solve them.  Cares for self well.  Works hard. Persistent.      Barriers:  Communication skills - affects is relationship as well as work, anxiety, attention span.   Legal History: Pending legal issue / charges: The patient has no significant history of legal issues.  Medical History/Surgical History: reviewed Past Medical History:  Diagnosis Date  Allergy    Colitis    Diabetes mellitus without complication ([place])    Hypertension     Past Surgical History:  Procedure Laterality Date   LAPAROSCOPIC GASTRIC SLEEVE RESECTION     NIPPLE SPARING MASTECTOMY Bilateral 08/2016   TOTAL ABDOMINAL HYSTERECTOMY Bilateral 07/2016   with BSO done by Dr. Illene Bolus at [facility]    Medications: Current Outpatient Medications  Medication Sig Dispense Refill   acetaminophen (TYLENOL) 500 MG tablet Take 1,000 mg by mouth every 6 (six) hours as needed for mild pain.     Lactobacillus (PROBIOTIC ACIDOPHILUS) CAPS Take 1 capsule by mouth daily.     LOMAIRA 8 MG TABS Take 1 tablet by mouth daily.     Multiple Vitamin (MULTIVITAMIN) capsule Take 1 capsule by mouth daily.     NEEDLE, DISP, 18 G 18G X 1-1/2" MISC 1 Units by Does not apply route as directed. 100 each 0   NEEDLE, DISP, 23 G 23G X 3/4" MISC 1 Units by Does not apply route as needed. 100 each 0   Syringe, Disposable, 1 ML MISC 1  Units by Does not apply route as needed. 60 each 0   testosterone cypionate (DEPOTESTOSTERONE CYPIONATE) 200 MG/ML injection Inject 0.5 mLs (100 mg total) into the muscle every 14 (fourteen) days. 6 mL 1   topiramate (TOPAMAX) 50 MG tablet Take 50 mg by mouth daily.     No current facility-administered medications for this visit.  Taking medication for weight loss and headaches.    No Known Allergies, had digestion problems in the past - not sure of the cause - better with dietary changes.  No concussions, head injuries, seizures.    Diagnoses:  Generalized anxiety disorder  Social anxiety disorder  R/O ASD & ADHD  Plan of Care: Patient presents with excessive worry and anxiety in social situations.  He has a history of impaired social interaction and attention/learning deficits, requiring special education to graduate high school.  He struggles with social communication and adapting to change which impairs his functioning at work and relationships.  Patient suspected having Autism Spectrum Disorder (ASD).  Testing recommended to evaluate for ASD and ADHD as well as other conditions that may be affection social interaction, communication, and attention.    Test Battery - In person K-BIT-2, CNSVS, BRIEF-A, Adult ADHD, Adult OCD, DASS, SASCA, ADOS-2 Module 4, SRS-2 (S & O).  Rainey Pines, PhD

## 2022-11-26 NOTE — Progress Notes (Signed)
                Calden Dorsey, PhD 

## 2022-12-03 ENCOUNTER — Encounter: Payer: Self-pay | Admitting: Psychology

## 2022-12-03 ENCOUNTER — Ambulatory Visit (INDEPENDENT_AMBULATORY_CARE_PROVIDER_SITE_OTHER): Payer: 59 | Admitting: Psychology

## 2022-12-03 DIAGNOSIS — F4010 Social phobia, unspecified: Secondary | ICD-10-CM

## 2022-12-03 DIAGNOSIS — F411 Generalized anxiety disorder: Secondary | ICD-10-CM

## 2022-12-03 NOTE — Progress Notes (Signed)
[place] Counselor/Therapist Progress Note  Patient ID: Mike Boyd, MRN: [id number],    Date: 12/03/2022  Time Spent: 8-10 am   Treatment Type: Testing  Met with patient for testing session.  Patient was at the clinic and session was conducted from therapist's office in person.    Reported Symptoms/Reason for Referral: Patient presents with excessive worry and anxiety in social situations.  He has a history of impaired social interaction and attention/learning deficits, requiring special education to graduate high school.  He struggles with social communication and adapting to change which impairs his functioning at work and relationships.  Patient suspected having Autism Spectrum Disorder (ASD).  Testing recommended to evaluate for ASD and ADHD as well as other conditions that may be affection social interaction, communication, and attention.     Mental Status Exam: Appearance:  Casually dressed and neatly Groomed     Behavior: Appropriate  Motor: Normal  Speech/Language:  Clear and Coherent and Normal Rate  Affect: Constricted  Mood: euthymic  Thought process: normal  Thought content:   WNL  Sensory/Perceptual disturbances:   WNL  Orientation: oriented to person, place, time/date, and situation  Attention: Good  Concentration: Fair  Memory: WNL  Fund of knowledge:  Fair  Insight:   Fair  Judgment:  Good  Impulse Control: Good   Risk Assessment: Danger to Self:  No Self-injurious Behavior: No Danger to Others: No Duty to Warn:no Physical Aggression / Violence:No   Subjective: Testing included the K-BIT-2 (0.75 hrs. for testing and scoring) along with the CNS Vital signs (0.75 hrs.) and BRIEF-A (0.5 hrs.).  Patient was cooperative and displayed good effort. Attention and concentration were adequate overall, although patient exhibited several instances of asking questions to be repeated, and missing relatively easy problems.  Mood was euthymic with  restricted affect.  Patient was casually dressed (t-shirt and jeans) with clothing and tattoos indicated references to death (dead inside & cut here).  The results appear representative of current functioning.    Diagnosis:Generalized anxiety disorder  Social anxiety disorder  Plan: Testing to continue next session with the ADOS 2 Module 4 and SRS-2 (Self and other report) followed by report writing and interactive feedback.      Rainey Pines, PhD

## 2022-12-03 NOTE — Progress Notes (Signed)
                Urias Sheek, PhD 

## 2022-12-07 ENCOUNTER — Encounter: Payer: Self-pay | Admitting: Psychology

## 2022-12-07 ENCOUNTER — Ambulatory Visit (INDEPENDENT_AMBULATORY_CARE_PROVIDER_SITE_OTHER): Payer: 59 | Admitting: Psychology

## 2022-12-07 DIAGNOSIS — F411 Generalized anxiety disorder: Secondary | ICD-10-CM

## 2022-12-07 DIAGNOSIS — F4010 Social phobia, unspecified: Secondary | ICD-10-CM

## 2022-12-07 NOTE — Progress Notes (Signed)
[place] Counselor/Therapist Progress Note  Patient ID: Mike Boyd, MRN: [id number],    Date: 12/07/2022  Time Spent: 8:15-10:15 am   Treatment Type: Testing  Met with patient for testing session.  Patient was at the clinic and session was conducted from therapist's office in person.    Reported Symptoms/Reason for Referral: Patient presents with excessive worry and anxiety in social situations.  He has a history of impaired social interaction and attention/learning deficits, requiring special education to graduate high school.  He struggles with social communication and adapting to change which impairs his functioning at work and relationships.  Patient suspected having Autism Spectrum Disorder (ASD).  Testing recommended to evaluate for ASD and ADHD as well as other conditions that may be affection social interaction, communication, and attention.     Mental Status Exam: Appearance:  Casually dressed and neatly Groomed     Behavior: Appropriate  Motor: Normal  Speech/Language:  Clear and Coherent and Normal Rate  Affect: Constricted  Mood: euthymic  Thought process: normal  Thought content:   WNL  Sensory/Perceptual disturbances:   WNL  Orientation: oriented to person, place, time/date, and situation  Attention: Good  Concentration: Fair  Memory: [place] of knowledge:  Fair  Insight:   good  Judgment:  Good  Impulse Control: Good   Risk Assessment: Danger to Self:  No Self-injurious Behavior: No Danger to Others: No Duty to Warn:no Physical Aggression / Violence:No   Subjective: Testing included the ADOS 2 Module 4  (1.5 hrs. for testing and scoring) along with the SRS-2 (Self and other report) (0.5 hrs.).  Patient was cooperative and displayed good effort. Attention and concentration were adequate overall, although patient exhibited several instances of asking questions to be repeated, and missing relatively easy problems.  Mood was euthymic with  restricted affect.  Patient was casually dressed (t-shirt and jeans) with clothing and tattoos indicated references to death (dead inside & cut here).  He exhibited significant body odor during the second session.   Patient was socially responsive, but primarily when talking about his dogs.  The results appear representative of current functioning.    Diagnosis:Generalized anxiety disorder  Social anxiety disorder  Plan: Testing complete.  Report writing to be conducted followed by interactive feedback next session.      Rainey Pines, PhD                 Rainey Pines, PhD

## 2022-12-14 ENCOUNTER — Encounter: Payer: Self-pay | Admitting: Psychology

## 2022-12-14 NOTE — Progress Notes (Signed)
Devery Murgia is a 31 y.o. adult patient Report writing competed ( 3 hrs.).  Interactive feedback to be conducted next session. Report to be attached to the feedback progress note.  Patient/Guardian was advised Release of Information must be obtained prior to any record release in order to collaborate their care with an outside provider. Patient/Guardian was advised if they have not already done so to contact the registration department to sign all necessary forms in order for [place] to release information regarding their care.   Consent: Patient/Guardian gives verbal consent for treatment and assignment of benefits for services provided during this visit. Patient/Guardian expressed understanding and agreed to proceed.    Bryson Dames, PhD

## 2022-12-16 ENCOUNTER — Encounter: Payer: Self-pay | Admitting: Psychology

## 2022-12-16 ENCOUNTER — Ambulatory Visit (INDEPENDENT_AMBULATORY_CARE_PROVIDER_SITE_OTHER): Payer: 59 | Admitting: Psychology

## 2022-12-16 DIAGNOSIS — F331 Major depressive disorder, recurrent, moderate: Secondary | ICD-10-CM | POA: Diagnosis not present

## 2022-12-16 DIAGNOSIS — F411 Generalized anxiety disorder: Secondary | ICD-10-CM

## 2022-12-16 DIAGNOSIS — F840 Autistic disorder: Secondary | ICD-10-CM | POA: Diagnosis not present

## 2022-12-16 NOTE — Progress Notes (Signed)
Psychological Testing Report - Confidential  Identifying Information:               Patient's Name:   Mike Boyd  Date of Birth:              03-12-1991     Age:                 31 years  MRN#:                                   [id number]      Dates of Assessment:  December 7 & 11, 2023         Purpose of Evaluation:  The purpose of the evaluation is to provide diagnostic information and treatment recommendations.  Mr. Mike Boyd was the informant for this evaluation.     Referral Information: Mr. Mike Boyd was a 31 year old 83 transgender male who was self-referred for evaluation regarding a possible neurodevelopmental disorder such, as Autism Spectrum Disorder (ASD) or Attention Deficit Hyperactivity Disorder (ADHD).  Mr. Mike Boyd reported having difficulty with learning, communicating, and interacting with people.  Teachers had suggested to his parents about getting Mr. Mike Boyd tested for extra help at school, but his parents declined to do so.  He wanted to get tested during adulthood but had trouble accessing services.  Mr. Mike Boyd reported having trouble communicating with people at work, and he indicated wanting to understand himself better.  He reported struggling with maintaining adult responsibilities.        Relevant Background Information:  Mr. Mike Boyd reported not being aware of any developmental delays, other than his elementary school wanting to him to repeat 2nd grade due to lack of progress.  His parents argued against this.  Motor coordination was reported to be good, although Mr. Mike Boyd does not participate in any sports or formal physical activity.  Handwriting was reported to be well developed, but he struggles with shoe tying.  He mostly wearing slip on shoes.  Regarding speech, Mr. Mike Boyd reported difficulty with expressing his thoughts clearly.  Self-care and independent skills were reported to be typically developed.  Socially, Mr. Mike Boyd indicated staying mostly to  himself.  He has two friends but doesn't speak to them often.  He gets the most companionship from his dogs.     Medical history was reported to be significant for allergies,  Colitis, Diabetes, and Hypertension. Past Surgical History includes Laparoscopic Gastric Sleeve Resection, Nipple Sparing Mastectomy, and Total Abdominal Hysterectomy. Mr. Mike Boyd reported having digestion problems in the past, which have improved with dietary changes.  He denied a history of concussions, head injuries, or seizures.  Medication history is significant for Lomaira, Testosterone Cypionate, And Topiramate (Topamax).  Mr. Withem currently takes medication for weight loss and headaches. Previous psychological history is significant for depression.  He has previously seen a psychotherapist related to depression. A history of psychiatric hospitalization and previous psychological testing was denied.  Alcohol and recreational drug use were also denied.                             Educationally, Mr. Mike Boyd reported that he earned his Graduate Equivalency Diploma (GED) for high school.  He struggled academically during high school, before transferring to a smaller school during 10th grade that provided more individual attention, allowing him graduate.  He had one friend during  high school and one from elementary school.  He is still friends with both individuals but is not social otherwise.  Mr. Mike Boyd currently works for [employer].  He has been working there for 7 years.  This is the longest job he has had.  He reported struggling when changing departments.  He has difficulty learning new information during training, to the point of getting written up, but he eventually adapts and does well on the job.  Currently, Mr. Mike Boyd is being asked to train others and he reported having trouble explaining procedures to others.  He would prefer to work alone and currently fears transitioning  to a new department or getting promoted.  He typically doesn't know what questions to ask when he needs help.          Mr. Mike Boyd currently lives alone with 2 dogs.  He speaks with mother daily but has an emotionally distant relationship with her.  He was close to father, but his father passed away a few years ago.  Mr. Schafer has two brothers.  He reported being close to one brother but more distant/cordial to the other.  He reported being closest to his oldest brother and cousin.  Mr. Mike Boyd has never married but is currently dating an individual named Alvis Lemmings. They have been dating less than a year.  Mr. Mike Boyd reported that he tries to explain his issues to them, but they feel like he doesn't understand them or their needs.  Mr. Mike Boyd gets frustrated when he cannot communicate his feelings effectively instead of calmly trying to understand the situation.  He does not have any children.  A history of mental health conditions with the family was denied.   Childhood was reported to be relatively stable without any abuse, neglect, or mistreatment other than his parents' denial of his social and academic struggles.  Current stressors include Occupational concerns.  financial difficulties, family conflict, and occupational concerns.  Barriers include poor communication skills which affect his dating relationship as well as his work, anxiety level, and attention span.  Strengths consist of identifying problems and taking accountability.  He cares for self well, works hard, and is persistent.        Presenting Symptomology:  Mr. Mike Boyd reported that he has trouble falling asleep.  He takes ZZquil to sleep otherwise would stay up most of the night.  There have been no recent changes in appetite, and he reported having good energy during the day.  Previous episodes of depression were reported but not occurring currently.  The last time he was depressed was the end of 2022 to the beginning of 2023.  Panic attacks  were denied, although mr. Mike Boyd reported becoming anxious while driving, going outside in general, and being anywhere near people.  He has general worry as well as social anxiety.  Obsessive thought includes understanding work tasks.  He is compulsive with work, including excessive checking to make sure his numbers are correct.  He pushes himself too hard at work and doesn't regulate his work behavior.  Mr. Mike Boyd reported having some trouble paying attention, becoming easily distracted, frequently losing or forgetting, and struggling with organization.  He often interrupts others and engages in impulsive behavior.  Mr. Mike Boyd reported having trouble relating to most peers.  He has trouble with interpreting nonverbal cues bit is improving in this area.  He has two friends, but doesn't see them often, and is in a dating relationship for the first time  in 11-12 years.  He denied repetitive speech or behavior.  Overly intense interests include action figures, Luther Parody, and dogs.  He struggles with change and transition and is overly sensitive to lights, sounds, smells.  He prefers staying in the dark.                                              Procedures Administered: Terie Purser Brief Intelligence Test - 2 Behavior Rating Inventory for Executive Function - Adult Self Report  Adult ADHD Self Report Scale Adult OCD Inventory - Self Report Depression Anxiety and Stress Scale - Self Report Social Anxiety Scale - Self Report Autism Diagnostic Observation Schedule 2 Module 4 Social Responsiveness Scale 2 - Self and Other Report  Behavioral Observations:  Mr. Poet was cooperative and displayed good effort. Attention and concentration were adequate overall, although he exhibited several instances of asking questions to be repeated and missing relatively easy problems.  Mood was euthymic with restricted affect.  Mr. Fontenot was casually dressed (t-shirt and jeans) with clothing and tattoos indicating  references to death ("dead inside" and "cut here" on his neck).  He exhibited significant body odor during the second session.   He was socially responsive, but primarily when talking about his dogs.  The results appear representative of current functioning.  Brief mental status indicated typical general orientation and alertness.  Recent, delayed, and immediate memory appeared adequately developed.  Judgement and insight were good while general knowledge was fair.  Current hallucinations, delusions, and thoughts of self-harm were denied.    Test Results and Interpretation:   General Intellectual Functioning:  The K-BIT 2 was used to assess Mr. Oestreich performance across two areas of cognitive ability. When interpreting these scores, it is important to view the results as a snapshot of current intellectual functioning. As measured by the K-BIT 2, Mr. Hillyard's Composite IQ score fell within the below average range when compared to same age peers (CIQ = 40).  Mr. Mullens performance was relatively consistent across the Primary Index Scores, although Perceptual Reasoning (PRI = 80) was slightly less developed than Verbal Comprehension (VCI = 86).  The difference was not statistically significant.  This indicates mildly below age typical or better visual learning ability (below average) and language understanding (low average).  On individual subtests, Mr. Mendizabal performed within the low average range for verbal knowledge and inferential thinking (riddles).  Visual pattern analysis (matrices) was below average.  Overall, Mr. Binns appears to have mildly low nonverbal reasoning and verbal comprehension.   Terie Purser Brief Intelligence Test - 2 Composite Score Summary  Composite Domains  Sum of Raw Scores Composite Score Percentile Rank 90% Confidence Interval Qualitative Description  Verbal Comprehension  VC  77  86  18  81-92  Low Average   Nonverbal Reasoning PR 28 80   9 75-89 Below Average    Composite IQ  FSIQ - 81 10 77-86 Below Average     K-BIT 2 Continued Domain Subtest Name  Total Raw Score Scaled Score Percentile Rank  Verbal Verbal Knowledge VK 46   8 25  Comprehension Riddles Ri _0 Attention and Processing: The results of the CNS Vital Signs testing indicated low average overall neurocognitive processing ability, at a level below measured intellectual ability (below average).  Regarding areas related to attention problems, simple and complex  attention were average, while cognitive flexibility and executive function were low.  These are the domains most closely associated with attention deficits.  Motor/psychomotor speed was extremely low, while processing speed was low average and reaction time was low, indicating mildly slow thinking speed with very slow typical hand/eye coordination and slow responsiveness on computerized measures.  Visual memory was very low while verbal memory was average, indicating greater ability to remember memory words than images, with low memory overall.  Social Acuity (reading emotional expression) was low.  The results suggest that Mr. Cardenas appears to have age typical ability maintaining attending for simple and complex activities with slow responsiveness and impaired motor coordination, shifting attention, attending systematically recognizing emotions, and remembering images.  The results were deemed valid across all measures, except for working memory and sustain attention due to misunderstanding the instructions on one of the measures related to these areas.                                                               CNS Vital Signs   Domain Scores Standard Score %ile Validity Indicator Guideline  Neurocognitive Index 72 3 Yes Low  Composite Memory 75 5 Yes Low  Verbal Memory 93 32 Yes Average  Visual Memory 66 1 Yes Very Low   Psychomotor speed 45 1 Yes Extremely Low  Reaction Time 72 3 Yes Low  Complex Attention 91 27  Yes Average  Cognitive Flexibility 78 7 Yes Low  Processing Speed 87 19 Yes Low Average  Executive Function 78 7 Yes Low  Social Acuity 73 4 Yes Low  Working Memory 56 1 No Extremely Low  Sustained Attention 17 1 No Extremely Low  Simple Attention  107 68 Yes Average  Motor Speed 42 1 Yes Extremely Low   Executive Function: Mr. Gram completed the Self-Report Form of the Behavior Rating Inventory of Executive Function-Adult Version (BRIEF-A) on 12/03/2022. There are no missing item responses in the protocol. Ratings of Agustus Mike Boyd's self-regulation do not appear overly negative. Items were completed in a reasonable fashion, suggesting that the respondent did not respond to items in a haphazard or extreme manner. Responses are reasonably consistent. In the context of these validity considerations, ratings of Ulysess Mike Boyd's everyday executive function suggest some areas of concern. The overall index, the Global Executive Composite (GEC), was elevated (GEC T = 78, %ile = 98). Both the Behavioral Regulation (BRI) and the Metacognition (MI) Indexes were elevated (BRI T = 79, %ile = 98 and MI T = 73, %ile = 98).    Within these summary indicators, all the individual scales are valid. One or more of the individual BRIEF-A scales were elevated, suggesting that Etter Sjogren reports difficulty with some aspects of executive function. Concerns are noted with his ability to inhibit impulsive responses, adjust to changes in routine or task demands, modulate emotions, monitor social behavior, initiate problem solving or activity, sustain working memory, plan and organize problem-solving approaches, and attend to task-oriented output. Mr. Altland scores on the Shift and Emotional Control scales are elevated compared to age-matched peers. This profile suggests significant problem-solving rigidity combined with emotional dysregulation. Individuals with this profile tend to lose emotional control when  their routines or perspectives are challenged and/or flexibility is required. Additionally. Mr. Grissinger elevated  scores on the Inhibit scale, and the Behavioral Regulation and the Metacognition Indexes, suggest that he is perceived as having poor inhibitory control and/or suggest that more global behavioral dysregulation is having a negative effect on active metacognitive problem solving. Mr. Simonin ability to organize environment and materials is not described as problematic.        BRIEF-A Self Report Score Summary Table Scale/Index Raw score T score Percentile  Inhibit 18 71 97  Shift 16 83 >99  Emotional Control 24 71 96  Self-Monitor 14 72 97  Behavioral Regulation Index (BRI) 72 79 98  Initiate 17 68 96  Working Memory 23 91 >99  Plan/Organize 21 69 98  Task Monitor 13 69 98  Organization of Materials 14 54 70  Metacognition Index (MI) 51 73 77  Global Executive Composite (GEC) 160 78 98   Behavioral - Emotional Functioning: Self-report ratings of behavior and emotional functioning indicated severe overall attention and behavior regulation difficulty.  On the Adult ADHD Self Report Scale, Mr. Tinkey positively endorsed, as occurring often or very often, 8 of 9 items for intention/poor organization and 5 of 9 items for hyperactivity and poor impulse control.  Additionally, 5 of 6 critical items were highly endorsed including problems starting projects, organizing activities, and remembering appointments, along with frequent restlessness/fidgeting and feeling compelled to move.  Endorsement of at least 5 items in either category along with 4 critical items is considered at-risk for ADHD.    Ratings for general emotional functioning indicated clinically significant emotional distress as Mr. Sar responses on the Adult OCD Inventory indicated a severe level of impairment in this area, highly endorsing (occurring often or very often) 17 of the 20 items.  Additionally, moderate  depression was indicated along with severe anxiety and stress on the Depression, Anxiety and Stress Scale with high endorsement of 15 of the 21 items.  Finally, severe impairment was noted on the Social Anxiety Scale with high endorsement of 19 of 20 items.        Regarding symptoms of ASD, information from the ADOS 2 Module 4 indicated difficulty with several aspects of reciprocal social interaction observed during this administration, with little to no observance of social communication impairment or restricted repetitive behavior.  Within the area of communication, Mr. Pantaleo spoke in complete sentences.  There was little variation in his tone of voice, but it was not obviously odd.  There was no observation of echolalia or atypical repetitive speech.  He was able to adequately report routine and non-routine events, but only occasionally offered spontaneous personal information.  Mr. Lavine responded adequately to personal information from the examiner but did not ask socially related questions.  Reciprocal conversation appeared typically developed for his age and consistent with his cognitive level.  He demonstrated typical use of spontaneous descriptive gestures, with frequent use of emphatic or emotional gestures.  Socially, Mr. Spink exhibited difficulty sustaining eye contact, although he was able to look within the general direction of the examiner when laughing or smiling.  His range of facial expression seemed typical, and he occasionally directed facial expression to the examiner.  He demonstrated some expressed pleasure during the evaluation.  Mr. Cihlar exhibited adequate ability elaborating on personal emotions but showed some difficulty spontaneously and accurately identify emotions in others during pictures or stories.  Mr. Luber demonstrated adequate insight into the nature of social relationships, including his own role in these relationships.  His awareness of and ability to complete age  typical levels  of responsibility seemed appropriate.  Mr. Cuppett did not initiate social interaction and was only responsive to social advances from the examiner when the conversation was related to dogs.  Social rapport was difficult to maintain due to Mr. Fredell lack of initiation and limited social interest.  Mr. Banks demonstrated creative object use and could generate an adequate original short story.  Regarding behavior, Mr. Lutes did not demonstrate any sensory seeking behavior, odd movement, or self-injury.  Compulsive behavior was not observed, and he did not discuss any overly intense interests.     Current functioning regarding ASD related symptoms reported during daily activities was assessed using the Social Responsiveness Scale - 2.  Mr. Trulson completed the SRS-2 regarding socialization and behavior.  On this measure, the T Score of 82 was in the severe range. Scores in this range indicate deficiencies in reciprocal social behavior which are clinically significant and lead to severe and enduring interference with everyday social interactions.  Such scores are strongly associated with clinical diagnosis of an autism spectrum disorder.  Social communication and restricted repetitive behavior were both rated within the severe range.    Social Responsiveness Scale - 2 - Self Report                                Awr             Cog             Com           Mot            RRB Raw score               10                27                 46              23               26                          T-score                    61                84                 82              76               83  Awr = Social Awareness    Com = Social Engineer, structural = Social Cognition    Mot = Social Motivation  RRB = Restricted Interests and Repetitive Behavior  DSM-5 Compatible Subscales Raw score T-score  Social Communication and Interaction       106   81 Severe   Restricted Interests and  Repetitive Behavior        26   83 Severe   Mr. Ericsson cousin also completed the SRS-2.  On this measure, the T Score of 78 was in the severe range. Scores in this range indicate deficiencies in reciprocal social behavior which are clinically significant and lead to severe and enduring interference with everyday social interactions.  Such scores are strongly associated  with clinical diagnosis of an autism spectrum disorder.  Social communication and restricted repetitive behavior were both rated within the severe range.    Social Responsiveness Scale - 2 - Other Report                                Awr             Cog             Com           Mot            RRB Raw score               11                22                42               23              22                           T-score                    64                76                78               76              77  Awr = Social Awareness    Com = Social Engineer, structural = Social Cognition    Mot = Social Motivation  RRB = Restricted Interests and Repetitive Behavior  DSM-5 Compatible Subscales Raw score T-score  Social Communication and Interaction        98   78 Severe   Restricted Interests and Repetitive Behavior       22   77 Severe   Summary:   Mr. Etsitty was evaluated during December 2023 related to a history of impaired behavior and emotion regulation along with social interaction difficulty.  Mr. Mike Boyd presents with excessive worry and anxiety in social situations.  He has a history of impaired social interaction and attention/learning deficits, requiring special education to graduate high school.  He struggles with social communication and adapting to change which impairs his functioning at work and relationships.  Patient suspected having Autism Spectrum Disorder (ASD).  Testing recommended to evaluate for ASD and ADHD as well as other conditions that may be affection social interaction, communication, and  attention.  Test results indicated below average overall general intellectual ability (K-BIT 2), with relatively equally developed Nonverbal Reasoning (below average) and Verbal Comprehension (low average).  Testing for neurocognitive functioning (CNS Vital Signs) indicated low processing overall, despite age typical attention for simple and complex activities.  Clinically significant difficulty was noted regarding executive function, cognitive flexibility, visual memory, motor speed, responsiveness, and emotional acuity.  Ratings for executive function indicated great difficulty in this area with clinically significant impairment regarding behavior and cognitive regulation.  Ratings for behavior functioning suggested many ADHD-related symptoms, with significant emotional distress covering several anxiety, depression, and obsessive-compulsive related symptoms.  Testing for Autism Spectrum indicated observed difficulty with several aspects of social  reciprocity with few instances of communication impairment or restricted repetitive behavior were observed.  However, both self-report ratings and ratings from Mr. Mike Boyd cousin indicated a severe level of social communication deficits and restricted repetitive behavior.  Mr. Hausman appears to meet the criterion for ASD, based on testing and developmental history.  While criterion for ADHD were also met, testing for attention indicated age typical functioning in this area, suggesting that obsessive thought, anxiety, and executive function deficits are interfering with his ability to attend to and process complex tasks.  He also continued to meet the criterion for depression, which could have accounted for the extremely slow performance on motor tasks.  See below for recommendations.         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  Medication for attention deficits is not recommended as general attention appears adequately developed for activities that do not require executive function.  Stimulant medication may increase subjective feelings of anxiety and possibly further impair functioning.  Medication for obsessive thought, depressed mood and physical anxiety is recommended, which could indirectly improve attention and concentration.   Individual counseling is recommended to resume to help Mr. Mike Boyd with developing emotion regulation and coping skills.  Mr. Mike Boyd would benefit from a structured Cognitive Behavior Therapy or Acceptance and Commitment Therapy approach that focuses on the teaching of emotional expression, self-advocacy, assertive communication, calming, social interaction skills, cognitive flexibility, and perspective taking.  Mental alertness/energy can also be raised by increasing exercise, improving sleep, eating a healthy diet, and managing depression/stress.  Omega 3 fish oil supplements have been scientifically shown to improve cognitive functioning and could be an alternative for medication to improve attention and processing deficits.  Consult with a physician regarding any changes to physical regimen.  Consult the Social Thinking[employer]  by Leeroy Cha website for information and training in social skills.    Seek social support through an Autism spectrum support group well as accessing Nyra Jabs, Ph.D. regarding general information about ASD through either his website or You Tube.  Local support can be garnered through the [employer] in [place].  Support services and training may also be available through the [employer] of [place] and [facility].          Revonda Standard Bevin Mayall, Ph.D. Licensed Psychologist -  HSP-P [place] Licensed psychologist [place] Executive dysfunction can significantly impact an individual's ability to function at home, at school, at work, or in the community. Several different approaches to executive function intervention have been developed by neuropsychologists, rehabilitation specialists, and others that are aimed at helping individuals cope with executive dysfunction. One type of intervention involves the application of cognitive remediation techniques that typically emphasize repeated practice with tasks, such as memory and attention tasks, that are intended to improve the deficient skill. This form of intervention has demonstrated some success in treating people with executive dysfunction, such as individuals who have traumatic brain injury.   Another type of intervention involves teaching compensatory strategies. These strategies are designed to circumvent rather than directly improve deficits and also have demonstrated effectiveness in a number of patient populations. Still others emphasize the interaction of the individual within the environment and how antecedent environmental modifications, or  accommodations can facilitate executive functions. It should be noted that these approaches to dealing with executive dysfunction need not be mutually exclusive and many intervention programs are characterized by a hybrid approach.   Compensatory strategies themselves can take several forms including using external aides (e.g., use of a notebook), learning cognitive strategies (e.g., verbalization), and making environmental modifications (e.g., keeping workspace clutter-free). Research has demonstrated that both healthy adults as well as individuals who have executive deficits commonly rely on external aids for executive and other cognitive processes. The probability of success with compensatory strategies can be enhanced by building on an individual's  existing strategies, systematically training the new strategies, and tailoring the compensatory strategies to the individual's unique needs and environmental contexts. More frequent use of aides or strategies and the use of a greater variety of aides is helpful when it comes to memory, and this also may hold true for executive dysfunction.   For individuals with more severe executive dysfunction and/or those with additional deficits in other domains of functioning, such as memory and learning, assimilating, and applying compensatory strategies and aides may be difficult. Providing such individuals with a high degree of external support can help them successfully complete tasks with less error and improve self-esteem. Prolonged reliance on external support without any systematic plan for developing some degree of independent skill, however, may interfere with the individual's ability to learn from new experiences. In many cases, across the range of severity, behavioral change may best be achieved through supportive practice of routines within pertinent "natural" contexts such as the home, where fostering the development of behaviors and thoughts that are elicited by regular cues in the environment is facilitated. This form of compensatory strategy relies on habit formation, also referred to as implicit memory or procedural learning, aspects of which are relatively intact in many conditions where executive dysfunction is common.     [place], [place]  [id number]  Phone: [phone]  Fax: [phone]   Positive Behavior Support for People with  Developmental Disorders  Make a Schedule - Example  Time/Order Activity Picture Comment Completed  7:00am Get Ready for School - Dress, Eat, Brush Teeth  Clothes Put Homework in [place] to [employer] bus Raise your hand before you speak   3:30pm Return from School and rest Bed or couch Quiet  Activities only   4:00pm Start Homework Books Check spelling words   5:30pm Prepare for Dinner Table Set Table  Wash Hands   7:00pm Play/TV Time Toys TV Clean up toys when finished   8:00 Get Ready for Bed -  Pajamas, Brush Teeth, Story Bed In Bed by 8:30    Routines - A set of activities done the same way each time.  Examples Morning Routine -  Wake up  Go to bathroom  Get dressed  Eat breakfast  Brush teeth  Put on shoes.              Bedtime Routine - Get undressed Put on pajamas Brush teeth Relaxation activity (story or soft music) Get in bed  Cleaning Room Routine - Put toys in toy box Put books in bookshelf Put dirty clothes in hamper Put clean clothes in drawer Make Bed  Homework Routine -  Find quiet area with desk or table Get all needed books and papers Take out one assignment at a time Take a 5 minute break after each assignment Put completed assignments back in  folder Put folder in backpack when all assignments completed  A time limit can be used for breaks instead of assignment completion.  A timer can be used. Place more enjoyable activities after the less preferred activities to reinforce participation. Smaller routines can sometimes be combined to form larger routines.   Task Analysis - Breaking activities down into a series of small steps for the person to handle.  Cleaning Room Put toys in toy box Put dirty clothes in hamper Put books on bookshelf Make bed (Making bed can also be broken down into smaller steps)  Brushing Teeth Run water Place toothbrush under water Place toothbrush on sink Turn off water Remove toothpaste cap Squeeze toothpaste onto toothbrush Brush teeth Rinse toothbrush Fill cup with water Rinse mouth  Steps can be combined or added as needed depending upon functioning level.  Shaping - If a task or activity is too difficult and cannot be broken down any further, have the person do gradually closer approximations  to the desired behavior until you get the response that you want. Example -  Sleeping independently Sleep with other person next to bed Sleep with other person in room by door Sleep with other person just outside of door Sleep with other person in next room Sleep without other person  Communicating desire for an object Person leads you to object Person points to object Person points to picture of object Person gives picture of object to you Person vocalizes (any kind of vocalization) while giving picture to you Person makes vocalization that sounds like the correct word Person says the correct word    Scaffolding Gradually expand the person's experiences.  For example, teach new behaviors (one at a time) within the context of a familiar location, routine, and person.  When the person has practiced and is comfortable exhibiting the new behavior in the familiar setting, then have the practice the behavior in a slightly new setting by changing either the location, routine, or person.  Later another aspect of the environment can be changed until the person is able to demonstrate the behavior comfortably in multiple settings, with multiple people, and multiple circumstances.           Visual Prompts Picture Books - Place pictures of common objects, places, people, toys, activities, etc. in a book.  The person can point to the pictures of what they want or they can take the pictures out of the book and hand them to you.  Consult with a Speech/Language pathologist regarding the most appropriate form of communication for that person.  Picture Schedules - Attach pictures to your schedules and routine lists to increase understanding.  Ideas for Creating Pictures:             Website: [employer].com             Software: Transport planner: Take pictures of common objects, places etc.   Sensory Regulation Avoid place of excess stimulation such as crowded  department stores or restaurants.  Go to smaller places or during off peak hours.  If you have to go to a place that is highly stimulating, go for a short time or find a quiet place for the person to go for frequent breaks.  Ways to decrease excess stimulation: Quiet activities or soft music Firm touch such as deep massage or heavy blankets/vests Deep rhythmic breathing Separation from others Ways to increase stimulation -  When a person is under stimulated you may notice more odd and repetitive behaviors.  Getting the person involved in a meaningful activity can reduce the occurrence.  Loud noise or music    Light touch Short rapid breaths Spicy foods Other activities such as exercise, art, scents, and swinging can be either stimulating or calming depending upon the person.  Check with an Occupational Therapist about specific sensory activities.   Intervention for when Person Loses Control Caregiver stays calm Person goes to quiet area or other people leave the area so it becomes quiet. Person is left alone to calm self with only monitoring from the caregiver. There should not be any intervention until the person is calm. Once the person is calm, they can be redirected to another activity, given an alternative behavior perform instead of becoming upset, or have their options explained to them so they can make an appropriate choice. The person can be taught to take 10 deep breaths to assist in calming. The teaching should be done during times when the person is calm.  They can be reminded one time to use the breathing while upset. Physical restraint should only be used if the person is hurting himself or others. For prolonged behavioral outbursts, caregivers should switch monitoring the person every 15 minutes if possible so the care givers can remain calm themselves.  Transition - Steps to help with going from one activity to another: Set a specific time for when the activity will change and  inform the person ahead of time.  Make sure they know what the new activity will be and detail any actions they need to do in between such as cleaning.  Use a timer or some other way of letting the person know when the current activity is finished. Give the person a brief warning about 2-3 minutes before the activity is complete so they can mentally prepare for the change. When going to a new place or activity, bringing a familiar object may help ease anxiety.    Reviewing a picture or other schedule with the person prior to the activities can help can give the person advanced warning of changes. Social Stories (brief stories about social situations) can be written with the person to help them understand the concept of changes and about going from one activity to another.   Alternatives - Always give an alternative when the person is not able to get what they want. When a request is denied tell the person what they can have instead. When something is taken away, replace it with something else. If what the person wants is not available, let them know specifically when it will be available.  Use the schedule to show people when they can have what they want.  Reinforcing Positive Behaviors - Let the person know when they have behaved appropriately. Be specific about what they had done and how it was helpful.  E.g. "when you shared your toy with your sister it made her happy." Be careful about using excessive excitement, praise, or touch (E.g. pats on the back).  Many people with Autism are sensitive to these and may view this as aversive. Stay calm and show positive emotion when giving feedback.  Correcting Inappropriate Behaviors - Let the person know when they have behaved inappropriately and show them a more appropriate behavior.     Wait until the person is calm before applying any correction. The new behavior should help the person achieve the same outcome as the inappropriate behavior,  but in  a different way. The new behavior should be something the person can do.   Break the action into small steps whenever teaching a new behavior. Help the person practice the new behavior so it can eventually replace the old behavior.      Providing Consequences Consequences for appropriate and inappropriate behavior can be given under the following circumstances: Make sure the person knows and understands the consequences ahead of time.  Use pictures to demonstrate the consequences if needed.   Have the consequences be consistent with the behavior being exhibited.  Example: person hits sibling. Right Way - person apologizes, uses words or gentle touch, and performs a positive activity for sibling. Wrong Way - person is sent to their room or has toys taken away.   Always follow through on the consequences once they are stated. Provide a balance of positive and negative consequences so they person maintains their self-esteem.  Look for partial elements of positive behaviors if needed.   Revonda Standard Zev Blue, Ph.D. Licensed Clinical Psychologist - HSP-P [place] [phone] Email: [email or web address]                Rainey Pines, PhD

## 2022-12-16 NOTE — Progress Notes (Signed)
[facility] Counselor/Therapist Progress Note  Patient ID: Mike Boyd, MRN: [id number],    Date: 12/16/2022  Time Spent: 3-3:45 pm   Treatment Type:  Testing - Feedback Session  Met with patient to review results of testing.  Patient  was at home and session was conducted from therapist's office via video conferencing.  Patient  verbally consented to telehealth.       Reported Symptoms: Patient presents with excessive worry and anxiety in social situations.  He has a history of impaired social interaction and attention/learning deficits, requiring special education to graduate high school.  He struggles with social communication and adapting to change which impairs his functioning at work and relationships.  Patient suspected having Autism Spectrum Disorder (ASD).  Testing recommended to evaluate for ASD and ADHD as well as other conditions that may be affection social interaction, communication, and attention.     Subjective: Interactive feedback was conducted (1 hr.).  It was discussed how patient met the criterion for Autism Spectrum Disorder and Obsessive Compulsive disorder along with how these conditions affect his ability to learn, concentrate and relate to others.  Recommendations included discussing results with PCP or psychiatrist, developing a visual organization system, and resuming individual counseling to help build coping and social skills.  Patient expressed agreement with the results and recommendations.     Total Time of Testing: 8 hrs. Testing and Scoring: 4 hrs. Interactive Feedback:1 hr. Report Writing: 3 hrs.   Diagnosis: Autism Spectrum Disorder - Level 1 - Needs Support     With Obsessive-Compulsive behavior and Executive     Functions deficits  Generalized Anxiety Disorder  Major Depressive Disorder - Moderate  Plan: Report to be sent to parent and referring provider.

## 2022-12-16 NOTE — Progress Notes (Signed)
                Ameliah Baskins, PhD 

## 2023-03-29 ENCOUNTER — Encounter (HOSPITAL_COMMUNITY): Payer: Self-pay

## 2023-03-29 ENCOUNTER — Emergency Department (HOSPITAL_COMMUNITY)
Admission: EM | Admit: 2023-03-29 | Discharge: 2023-03-29 | Disposition: A | Discharge status: Home / Self Care | Payer: 59 | Attending: Emergency Medicine | Admitting: Emergency Medicine

## 2023-03-29 DIAGNOSIS — L03116 Cellulitis of left lower limb: Secondary | ICD-10-CM | POA: Diagnosis not present

## 2023-03-29 DIAGNOSIS — M7989 Other specified soft tissue disorders: Secondary | ICD-10-CM | POA: Diagnosis present

## 2023-03-29 MED ORDER — DOXYCYCLINE HYCLATE 100 MG PO CAPS: 100.0000 mg | ORAL_CAPSULE | Freq: Two times a day (BID) | ORAL | 0 refills | Status: AC

## 2023-03-29 MED ORDER — IBUPROFEN 600 MG PO TABS: 600.0000 mg | ORAL_TABLET | Freq: Four times a day (QID) | ORAL | 0 refills | Status: AC | PRN

## 2023-03-29 NOTE — ED Provider Notes (Signed)
[place] EMERGENCY DEPARTMENT AT [facility] Provider Note   CSN: [id number] Arrival date & time: 03/29/23  1837     History  Chief Complaint  Patient presents with   Leg Swelling    Mike Boyd is a 32 y.o. adult.  The history is provided by the patient and medical records. No language interpreter was used.     32 year old male presenting with concerns of foot infection.  Patient report for the past 3 to 4 days he has noticed increasing pain and swelling and redness to his left foot.  It felt similar to prior cellulitis that he has had in the past.  He denies any specific injury he denies any fever or chills no numbness no weakness.  He is trying to use over-the-counter medication without relief.  Denies any calf tenderness chest pain or shortness of breath no prior history of PE or DVT.  Home Medications Prior to Admission medications   Medication Sig Start Date End Date Taking? Authorizing Provider  acetaminophen (TYLENOL) 500 MG tablet Take 1,000 mg by mouth every 6 (six) hours as needed for mild pain.    [provider]  Lactobacillus (PROBIOTIC ACIDOPHILUS) CAPS Take 1 capsule by mouth daily.    [provider]  LOMAIRA 8 MG TABS Take 1 tablet by mouth daily. 08/07/22   [provider]  Multiple Vitamin (MULTIVITAMIN) capsule Take 1 capsule by mouth daily.    [provider]  NEEDLE, DISP, 18 G 18G X 1-1/2" MISC 1 Units by Does not apply route as directed. 02/24/19   Shawnee Knapp, MD  NEEDLE, DISP, 23 G 23G X 3/4" MISC 1 Units by Does not apply route as needed. 02/24/19   Shawnee Knapp, MD  Syringe, Disposable, 1 ML MISC 1 Units by Does not apply route as needed. 02/24/19   Shawnee Knapp, MD  testosterone cypionate (DEPOTESTOSTERONE CYPIONATE) 200 MG/ML injection Inject 0.5 mLs (100 mg total) into the muscle every 14 (fourteen) days. 02/24/19   Shawnee Knapp, MD  topiramate (TOPAMAX) 50 MG tablet Take 50 mg by mouth daily. 08/08/22    [provider]      Allergies    Patient has no known allergies.    Review of Systems   Review of Systems  All other systems reviewed and are negative.   Physical Exam Updated Vital Signs BP (!) 141/71 (BP Location: Left Arm)   Pulse 79   Temp 98.7 F (37.1 C) (Oral)   Resp 18   Ht 5\' 5"  (1.651 m)   Wt 102.1 kg   LMP 01/23/2016   SpO2 99%   BMI 37.44 kg/m  Physical Exam Constitutional:      General: He is not in acute distress.    Appearance: He is well-developed.  HENT:     Head: Atraumatic.  Eyes:     Conjunctiva/sclera: Conjunctivae normal.  Musculoskeletal:        General: Tenderness (Left foot: Erythema edema and warmth appreciated to the dorsum of the midfoot at with tenderness to palpation intact dorsi pedis pulse no deformity the brisk cap refill no ankle involvement) present.     Cervical back: Normal range of motion and neck supple.  Skin:    Findings: No rash.  Neurological:     Mental Status: He is alert.     ED Results / Procedures / Treatments   Labs (all labs ordered are listed, but only abnormal results are displayed) Labs Reviewed -  No data to display  EKG None  Radiology No results found.  Procedures Procedures    Medications Ordered in ED Medications - No data to display  ED Course/ Medical Decision Making/ A&P                             Medical Decision Making  BP (!) 141/71 (BP Location: Left Arm)   Pulse 79   Temp 98.7 F (37.1 C) (Oral)   Resp 18   Ht 5\' 5"  (1.651 m)   Wt 102.1 kg   LMP 01/23/2016   SpO2 99%   BMI 37.44 kg/m   18:63 PM 33 year old male to male presenting with concerns of foot infection.  Patient report for the past 3 to 4 days he has noticed increasing pain and swelling and redness to his left foot.  It felt similar to prior cellulitis that he has had in the past.  He denies any specific injury he denies any fever or chills no numbness no weakness.  He is trying to use over-the-counter  medication without relief.  Denies any calf tenderness chest pain or shortness of breath no prior history of PE or DVT.  On exam, well-appearing male resting comfortably appears to be in no acute discomfort.  Left foot is remarkable for erythema edema and warmth noted to the dorsum of the foot with tenderness to palpation but no crepitus and patient has brisk cap refill and intact dorsalis pedis pulse.  Calf nontender, and leg compartment soft.  DDx, cellulitis, ecchymosis, fracture, dislocation, peripheral edema, DVT.  Low suspicion for acute emergent medical condition.  Suspect cellulitis, will discharge home with antibiotic including doxycycline and gave return precaution.        Final Clinical Impression(s) / ED Diagnoses Final diagnoses:  Cellulitis of left foot    Rx / DC Orders ED Discharge Orders          Ordered    ibuprofen (ADVIL) 600 MG tablet  Every 6 hours PRN        03/29/23 1926    doxycycline (VIBRAMYCIN) 100 MG capsule  2 times daily        03/29/23 1926              Domenic Moras, PA-C 03/29/23 1927    Maylon Peppers, [place] K, DO 03/30/23 [phone]

## 2023-03-29 NOTE — ED Triage Notes (Signed)
Pt states swelling in his left foot since Thursday. Pt denies injury/trauma. Pt states he has had cellulitis before and this feels similar.

## 2023-03-29 NOTE — ED Notes (Signed)
Discharge summary/instructions reviewed with pt. Provided pt with opportunity to ask any questions, pt had no further questions or concerns. After-Visit Summary provided to pt. 

## 2023-03-29 NOTE — Discharge Instructions (Addendum)
Please take antibiotic as prescribed, take ibuprofen as needed for pain.  You notice improvement of your symptoms after 2 days.  If you notice worsening pain, tenderness to your calf, having trouble breathing, or having fever please return for reassessment.

## 2023-09-04 ENCOUNTER — Emergency Department (HOSPITAL_COMMUNITY)
Admission: EM | Admit: 2023-09-04 | Discharge: 2023-09-04 | Disposition: A | Discharge status: Home / Self Care | Payer: 59 | Attending: Emergency Medicine | Admitting: Emergency Medicine

## 2023-09-04 ENCOUNTER — Other Ambulatory Visit: Payer: Self-pay

## 2023-09-04 ENCOUNTER — Encounter (HOSPITAL_COMMUNITY): Payer: Self-pay | Admitting: Emergency Medicine

## 2023-09-04 DIAGNOSIS — R531 Weakness: Secondary | ICD-10-CM | POA: Insufficient documentation

## 2023-09-04 DIAGNOSIS — R197 Diarrhea, unspecified: Secondary | ICD-10-CM | POA: Insufficient documentation

## 2023-09-04 LAB — CBC WITH DIFFERENTIAL/PLATELET
Abs Immature Granulocytes: 0.02 10*3/uL (ref 0.00–0.07)
Basophils Absolute: 0 10*3/uL (ref 0.0–0.1)
Basophils Relative: 0 %
Eosinophils Absolute: 0.1 10*3/uL (ref 0.0–0.5)
Eosinophils Relative: 1 %
HCT: 41.1 % (ref 39.0–52.0)
Hemoglobin: 13.2 g/dL (ref 13.0–17.0)
Immature Granulocytes: 0 %
Lymphocytes Relative: 27 %
Lymphs Abs: 1.8 10*3/uL (ref 0.7–4.0)
MCH: 27 pg (ref 26.0–34.0)
MCHC: 32.1 g/dL (ref 30.0–36.0)
MCV: 84 fL (ref 80.0–100.0)
Monocytes Absolute: 0.4 10*3/uL (ref 0.1–1.0)
Monocytes Relative: 6 %
Neutro Abs: 4.5 10*3/uL (ref 1.7–7.7)
Neutrophils Relative %: 66 %
Platelets: 296 10*3/uL (ref 150–400)
RBC: 4.89 MIL/uL (ref 4.22–5.81)
RDW: 13.2 % (ref 11.5–15.5)
WBC: 6.9 10*3/uL (ref 4.0–10.5)
nRBC: 0 % (ref 0.0–0.2)

## 2023-09-04 LAB — I-STAT CHEM 8, ED
BUN: 9 mg/dL (ref 6–20)
Calcium, Ion: 1.23 mmol/L (ref 1.15–1.40)
Chloride: 108 mmol/L (ref 98–111)
Creatinine, Ser: 1.2 mg/dL (ref 0.61–1.24)
Glucose, Bld: 85 mg/dL (ref 70–99)
HCT: 42 % (ref 39.0–52.0)
Hemoglobin: 14.3 g/dL (ref 13.0–17.0)
Potassium: 4.2 mmol/L (ref 3.5–5.1)
Sodium: 139 mmol/L (ref 135–145)
TCO2: 21 mmol/L — ABNORMAL LOW (ref 22–32)

## 2023-09-04 LAB — I-STAT CG4 LACTIC ACID, ED: Lactic Acid, Venous: 0.7 mmol/L (ref 0.5–1.9)

## 2023-09-04 NOTE — ED Triage Notes (Signed)
Pt reports while training his dog he began to feel lightheaded. PT reports the lights in the room were causing him to feel lightheaded. Denies pain.

## 2023-09-04 NOTE — ED Provider Notes (Signed)
[place] EMERGENCY DEPARTMENT AT [facility] Provider Note   CSN: [id number] Arrival date & time: 09/04/23  1412     History  Chief Complaint  Patient presents with   Lightheaded    Geoffrey Dicroce is a 32 y.o. adult.  32 year old trans male who presents with feeling lightheaded times several days.  States he felt as if the room was spinning and became lightheaded.  Denies any syncopal event with this.  Has not had any recent illnesses.  Denies any fever, emesis.  Slight watery diarrhea.  No abdominal pain or chest discomfort.  No new medications.  Nothing makes his symptoms better or worse no treatment use prior to arrival       Home Medications Prior to Admission medications   Medication Sig Start Date End Date Taking? Authorizing Provider  acetaminophen (TYLENOL) 500 MG tablet Take 1,000 mg by mouth every 6 (six) hours as needed for mild pain.    [provider]  doxycycline (VIBRAMYCIN) 100 MG capsule Take 1 capsule (100 mg total) by mouth 2 (two) times daily. 03/29/23   Fayrene Helper, PA-C  ibuprofen (ADVIL) 600 MG tablet Take 1 tablet (600 mg total) by mouth every 6 (six) hours as needed. 03/29/23   Fayrene Helper, PA-C  Lactobacillus (PROBIOTIC ACIDOPHILUS) CAPS Take 1 capsule by mouth daily.    [provider]  LOMAIRA 8 MG TABS Take 1 tablet by mouth daily. 08/07/22   [provider]  Multiple Vitamin (MULTIVITAMIN) capsule Take 1 capsule by mouth daily.    [provider]  NEEDLE, DISP, 18 G 18G X 1-1/2" MISC 1 Units by Does not apply route as directed. 02/24/19   Sherren Mocha, MD  NEEDLE, DISP, 23 G 23G X 3/4" MISC 1 Units by Does not apply route as needed. 02/24/19   Sherren Mocha, MD  Syringe, Disposable, 1 ML MISC 1 Units by Does not apply route as needed. 02/24/19   Sherren Mocha, MD  testosterone cypionate (DEPOTESTOSTERONE CYPIONATE) 200 MG/ML injection Inject 0.5 mLs (100 mg total) into the muscle every 14 (fourteen) days. 02/24/19    Sherren Mocha, MD  topiramate (TOPAMAX) 50 MG tablet Take 50 mg by mouth daily. 08/08/22   [provider]      Allergies    Patient has no known allergies.    Review of Systems   Review of Systems  All other systems reviewed and are negative.   Physical Exam Updated Vital Signs BP 121/70 (BP Location: Right Arm)   Pulse 94   Temp 98.5 F (36.9 C) (Oral)   Resp 16   LMP 01/23/2016   SpO2 100%  Physical Exam Vitals and nursing note reviewed.  Constitutional:      General: He is not in acute distress.    Appearance: Normal appearance. He is well-developed. He is not toxic-appearing.  HENT:     Head: Normocephalic and atraumatic.  Eyes:     General: Lids are normal.     Conjunctiva/sclera: Conjunctivae normal.     Pupils: Pupils are equal, round, and reactive to light.  Neck:     Thyroid: No thyroid mass.     Trachea: No tracheal deviation.  Cardiovascular:     Rate and Rhythm: Normal rate and regular rhythm.     Heart sounds: Normal heart sounds. No murmur heard.    No gallop.  Pulmonary:     Effort: Pulmonary effort is normal. No respiratory distress.  Breath sounds: Normal breath sounds. No stridor. No decreased breath sounds, wheezing, rhonchi or rales.  Abdominal:     General: There is no distension.     Palpations: Abdomen is soft.     Tenderness: There is no abdominal tenderness. There is no rebound.  Musculoskeletal:        General: No tenderness. Normal range of motion.     Cervical back: Normal range of motion and neck supple.  Skin:    General: Skin is warm and dry.     Findings: No abrasion or rash.  Neurological:     Mental Status: He is alert and oriented to person, place, and time. Mental status is at baseline.     GCS: GCS eye subscore is 4. GCS verbal subscore is 5. GCS motor subscore is 6.     Cranial Nerves: Cranial nerves are intact. No cranial nerve deficit.     Sensory: No sensory deficit.     Motor: Motor function is intact.   Psychiatric:        Attention and Perception: Attention normal.        Speech: Speech normal.        Behavior: Behavior normal.     ED Results / Procedures / Treatments   Labs (all labs ordered are listed, but only abnormal results are displayed) Labs Reviewed  CBC WITH DIFFERENTIAL/PLATELET  I-STAT CHEM 8, ED    EKG EKG Interpretation Date/Time:  Saturday September 04 2023 14:34:54 EDT Ventricular Rate:  91 PR Interval:  150 QRS Duration:  72 QT Interval:  320 QTC Calculation: 394 R Axis:   21  Text Interpretation: Sinus rhythm Low voltage, precordial leads Confirmed by Lorre Nick ([id number]) on 09/04/2023 2:41:57 PM  Radiology No results found.  Procedures Procedures    Medications Ordered in ED Medications - No data to display  ED Course/ Medical Decision Making/ A&P                                 Medical Decision Making Amount and/or Complexity of Data Reviewed Labs: ordered. ECG/medicine tests: ordered.   Patient is EKG per interpretation shows normal sinus rhythm.  Patient's electrolytes within normal is here.  Hemoglobin stable.  Patient notes that he has been getting between 4 and 6 hours of sleep at night.  I feel that this is part of his issue.  No concern for serious neurologic or cardiovascular etiology of his symptoms.  Will discharge        Final Clinical Impression(s) / ED Diagnoses Final diagnoses:  None    Rx / DC Orders ED Discharge Orders     None         Lorre Nick, MD 09/04/23 1636

## 2023-12-06 ENCOUNTER — Encounter: Payer: Self-pay | Admitting: Internal Medicine

## 2023-12-06 ENCOUNTER — Ambulatory Visit: Payer: 59 | Attending: Internal Medicine | Admitting: Internal Medicine

## 2023-12-06 VITALS — BP 110/78 | HR 94 | Ht 65.0 in | Wt 221.2 lb

## 2023-12-06 DIAGNOSIS — R42 Dizziness and giddiness: Secondary | ICD-10-CM | POA: Diagnosis not present

## 2023-12-06 NOTE — Patient Instructions (Signed)
Medication Instructions:  Please STOP Benicar.  *If you need a refill on your cardiac medications before your next appointment, please call your pharmacy*   Lab Work: None.  If you have labs (blood work) drawn today and your tests are completely normal, you will receive your results only by: MyChart Message (if you have MyChart) OR A paper copy in the mail If you have any lab test that is abnormal or we need to change your treatment, we will call you to review the results.   Testing/Procedures: None.   Follow-Up:  Please send [place] a message on MyChart in a 2 weeks letting us know your blood pressure readings and symptoms. Send your readings and symptom log to:    Provider:   Dr. Dietrich Pates, MD

## 2023-12-06 NOTE — Progress Notes (Unsigned)
Cardiology Office Note   Date:  12/06/2023   ID:  Mike Boyd, DOB 05/18/1991, MRN [id number]  PCP:  Mike Riddle, PA-C  Cardiologist:   Mike Pates, MD       History of Present Illness: Mike Boyd is a 32 y.o. adult with a history of obesity (s/p gastric sleeve)dizziness  Seen in ER in Sept 2024 Unable to keep balance Presyncopal    Pt says spells of lightheadedness started about 9 months ago In Sept he had an Fe infusion that helped some  He is followed by Mike Boyd   BP low at that visit  Benicar decreased to 10 mg (he has been on it for 6 years)  2 years ago had weight loss surgery   Daily routine: 5:30 AM  Get up   take dogs out   Feed them Drinks water  Breakfast oatmeal  7 AM   Logs into work   Says he feels rough after a sitting for 1.5 hours  Lunch  Fish, [place]    Appetite has been down  Drinks water in am   Avoids caffeine  Makes feel bad Afternoon  Back on computer   till 5 PM  Sitting   Drinks water Mike Boyd noodles for dinner   Pt says overall he feels a little better in the morning    Appetite down  for a few wks   Chest pains R parasternal  Worse with breath  Current Meds  Medication Sig   acetaminophen (TYLENOL) 500 MG tablet Take 1,000 mg by mouth every 6 (six) hours as needed for mild pain.   doxycycline (VIBRAMYCIN) 100 MG capsule Take 1 capsule (100 mg total) by mouth 2 (two) times daily.   ibuprofen (ADVIL) 600 MG tablet Take 1 tablet (600 mg total) by mouth every 6 (six) hours as needed.   Lactobacillus (PROBIOTIC ACIDOPHILUS) CAPS Take 1 capsule by mouth daily.   LOMAIRA 8 MG TABS Take 1 tablet by mouth daily.   Multiple Vitamin (MULTIVITAMIN) capsule Take 1 capsule by mouth daily.   NEEDLE, DISP, 18 G 18G X 1-1/2" MISC 1 Units by Does not apply route as directed.   NEEDLE, DISP, 23 G 23G X 3/4" MISC 1 Units by Does not apply route as needed.   Syringe, Disposable, 1 ML MISC 1 Units by Does not apply route as needed.   testosterone  cypionate (DEPOTESTOSTERONE CYPIONATE) 200 MG/ML injection Inject 0.5 mLs (100 mg total) into the muscle every 14 (fourteen) days.   topiramate (TOPAMAX) 50 MG tablet Take 50 mg by mouth daily.     Allergies:   Patient has no known allergies.   Past Medical History:  Diagnosis Date   Allergy    Colitis    Diabetes mellitus without complication (HCC)    Dizziness    Dysautonomia (HCC)    H/O gastric sleeve    History of elevated glucose    Hypertension    Iron deficiency    Lightheadedness    Nausea    SOB (shortness of breath)    Tachycardia    Transgender    Vitamin D deficiency     Past Surgical History:  Procedure Laterality Date   LAPAROSCOPIC GASTRIC SLEEVE RESECTION     NIPPLE SPARING MASTECTOMY Bilateral 08/2016   TESTICLE IMPLANTS REMOVAL     TOTAL ABDOMINAL HYSTERECTOMY Bilateral 07/2016   with BSO done by Dr. Everlene Other at [facility]     Social History:  The patient  reports  that he has never smoked. He has never used smokeless tobacco. He reports current alcohol use of about 2.0 standard drinks of alcohol per week. He reports that he does not use drugs.   Family History:  The patient's family history includes Alzheimer's disease in an other family member; Cancer in his paternal grandmother; Diabetes in his maternal grandmother and another family member; Heart disease (age of onset: 54) in his father; Hyperlipidemia in his maternal grandmother; Hypertension in his maternal grandmother and another family member; Stroke in his maternal grandmother.    ROS:  Please see the history of present illness. All other systems are reviewed and  Negative to the above problem except as noted.    PHYSICAL EXAM: VS:  BP 110/78   Pulse 94   Ht 5\' 5"  (1.651 m)   Wt 221 lb 3.2 oz (100.3 kg)   LMP 01/23/2016   SpO2 99%   BMI 36.81 kg/m   GEN:  OBese 32 yo  in no acute distress  HEENT: normal  Neck: no JVD,  Cardiac: RRR; no murmur no LE  edema  Respiratory:  clear to  auscultation GI: soft, nontender No hepatomegaly  MS: no deformity Moving all extremities    EKG:  EKG is not ordered today.   Lipid Panel    Component Value Date/Time   CHOL 121 02/24/2019 0934   TRIG 79 02/24/2019 0934   HDL 46 02/24/2019 0934   CHOLHDL 2.6 02/24/2019 0934   LDLCALC 59 02/24/2019 0934      Wt Readings from Last 3 Encounters:  12/06/23 221 lb 3.2 oz (100.3 kg)  03/29/23 225 lb (102.1 kg)  08/26/22 207 lb (93.9 kg)      ASSESSMENT AND PLAN:  1  Dizziness  Hx of lightheadedness for about 9 months   For now I would recomm stopping benicar   let BP run up Hydrate   At least 3 L per day   Push salt Increase activity    Get pedals for under desk to keep active during day  Elevate head of bed SPANX Small, more frequent meals   Avoid excess carbs, sugars     Current medicines are reviewed at length with the patient today.  The patient does not have concerns regarding medicines.  Signed, Mike Pates, MD  12/06/2023 1:55 PM    [facility] [address] [place], [place], [place]  [address] Phone: [phone]; Fax: [phone]

## 2023-12-13 ENCOUNTER — Telehealth: Payer: Self-pay | Admitting: Internal Medicine

## 2023-12-13 NOTE — Telephone Encounter (Signed)
Pt was seen in consult by Dr Tenny Craw 12/06/23 and he was seen by Cardio at [facility] he next day:   Palpitation/orthostatic hypotension-his symptoms are very ambiguous. He has been evaluated by Dr. Tenny Craw at [facility] yesterday, with a working diagnosis of orthostatic hypotension. I think it is very reasonable to stick with 1 provider for patient with multiple complaints. I was very honest to him that I do not have alternative diagnosis for him. He then asked me about how to address autonomic dysfunction. I told him that this is not my specialty, but I was happy to connect him with EP Dr. He told me that he went on to have an appointment until 2026. I tried to explain to him that even having a diagnosis of autonomic disorder, we might still want to stay with the plan of right to keep her hydration and salt intake.  Left a message for the pt to call back.

## 2023-12-13 NOTE — Telephone Encounter (Signed)
Pt called in stating he read about POTS and he would like to see Dr. Graciela Husbands. He has been advised that he is retiring soon and he has no availability until April (which has not been opened yet for EP). Pt is still insisting on seeing Dr. Graciela Husbands. Please advise.

## 2023-12-14 ENCOUNTER — Other Ambulatory Visit: Payer: Self-pay | Admitting: Medical Genetics

## 2024-01-10 ENCOUNTER — Other Ambulatory Visit (HOSPITAL_COMMUNITY)
Admission: RE | Admit: 2024-01-10 | Discharge: 2024-01-10 | Disposition: A | Discharge status: Home / Self Care | Payer: Self-pay | Source: Ambulatory Visit | Attending: Medical Genetics | Admitting: Medical Genetics

## 2024-02-04 LAB — GENECONNECT MOLECULAR SCREEN: Genetic Analysis Overall Interpretation: NEGATIVE
